# Patient Record
Sex: Female | Born: 1985 | Race: Black or African American | Hispanic: No | Marital: Single | Smoking: Current some day smoker
Health system: Southern US, Community
[De-identification: ages and names within clinical notes are randomized; demographics above are authoritative.]

## PROBLEM LIST (undated history)

## (undated) DIAGNOSIS — Z98891 History of uterine scar from previous surgery: Secondary | ICD-10-CM

## (undated) DIAGNOSIS — O99345 Other mental disorders complicating the puerperium: Secondary | ICD-10-CM

## (undated) DIAGNOSIS — F53 Postpartum depression: Secondary | ICD-10-CM

## (undated) DIAGNOSIS — IMO0002 Reserved for concepts with insufficient information to code with codable children: Secondary | ICD-10-CM

## (undated) DIAGNOSIS — O039 Complete or unspecified spontaneous abortion without complication: Secondary | ICD-10-CM

## (undated) DIAGNOSIS — R87619 Unspecified abnormal cytological findings in specimens from cervix uteri: Secondary | ICD-10-CM

## (undated) DIAGNOSIS — Z8751 Personal history of pre-term labor: Secondary | ICD-10-CM

## (undated) DIAGNOSIS — Z3490 Encounter for supervision of normal pregnancy, unspecified, unspecified trimester: Secondary | ICD-10-CM

## (undated) DIAGNOSIS — O4690 Antepartum hemorrhage, unspecified, unspecified trimester: Secondary | ICD-10-CM

## (undated) HISTORY — DX: Other mental disorders complicating the puerperium: O99.345

## (undated) HISTORY — DX: Reserved for concepts with insufficient information to code with codable children: IMO0002

## (undated) HISTORY — DX: History of uterine scar from previous surgery: Z98.891

## (undated) HISTORY — DX: Antepartum hemorrhage, unspecified, unspecified trimester: O46.90

## (undated) HISTORY — DX: Unspecified abnormal cytological findings in specimens from cervix uteri: R87.619

## (undated) HISTORY — DX: Complete or unspecified spontaneous abortion without complication: O03.9

## (undated) HISTORY — DX: Personal history of pre-term labor: Z87.51

## (undated) HISTORY — PX: FINGER FRACTURE SURGERY: SHX638

## (undated) HISTORY — DX: Postpartum depression: F53.0

## (undated) HISTORY — DX: Encounter for supervision of normal pregnancy, unspecified, unspecified trimester: Z34.90

---
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
## 2006-03-02 ENCOUNTER — Emergency Department (HOSPITAL_COMMUNITY): Admission: EM | Admit: 2006-03-02 | Discharge: 2006-03-03 | Payer: Self-pay | Admitting: Emergency Medicine

## 2007-04-07 IMAGING — US US OB COMP LESS 14 WK
1 series · 14 of 28 positions shown · non-contrast
Comparison: none

HISTORY: Abdominal pain, suspected tubal pregnancy

[Series 1: unknown · 0.33mm/px · 14 of 75 slices shown]
[im 3/75]
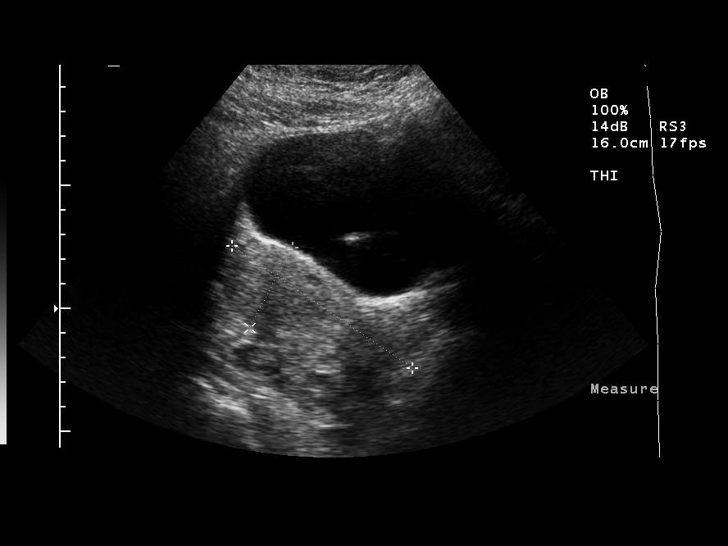
[im 9/75]
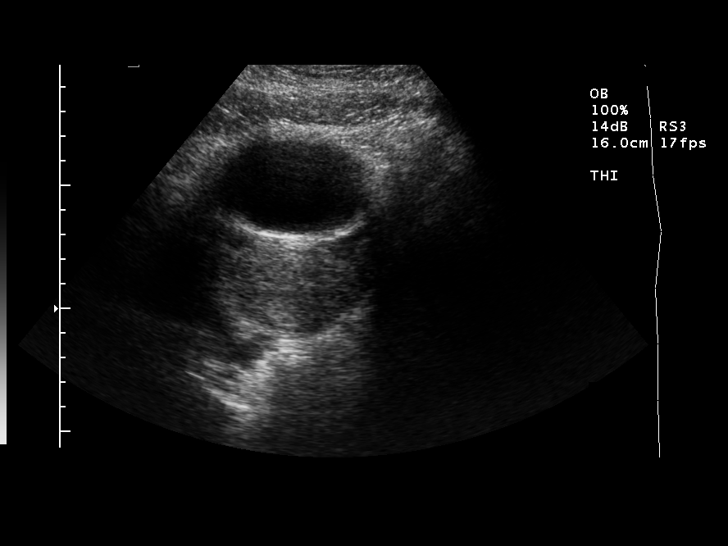
[im 14/75]
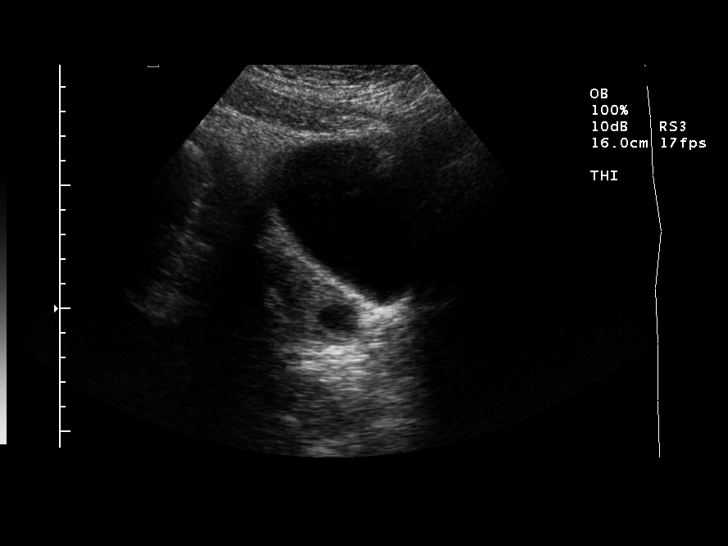
[im 20/75]
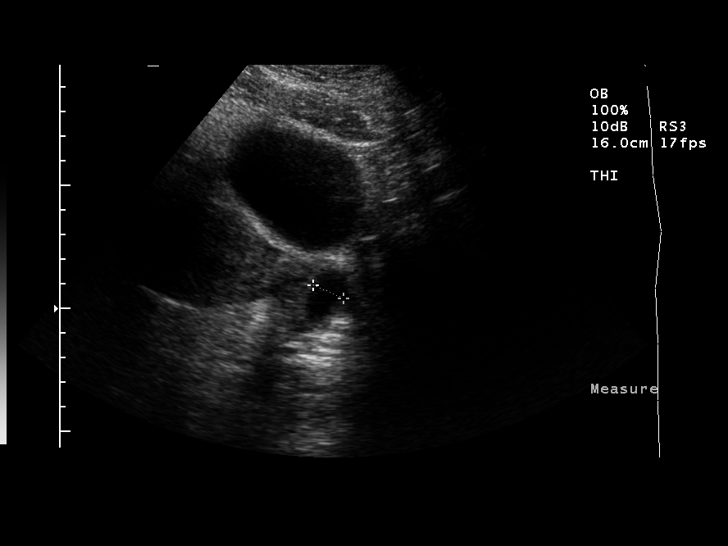
[im 25/75]
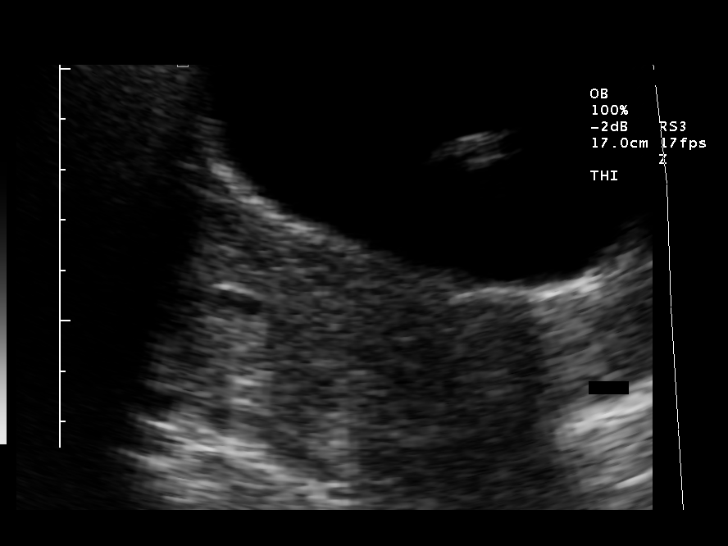
[im 31/75]
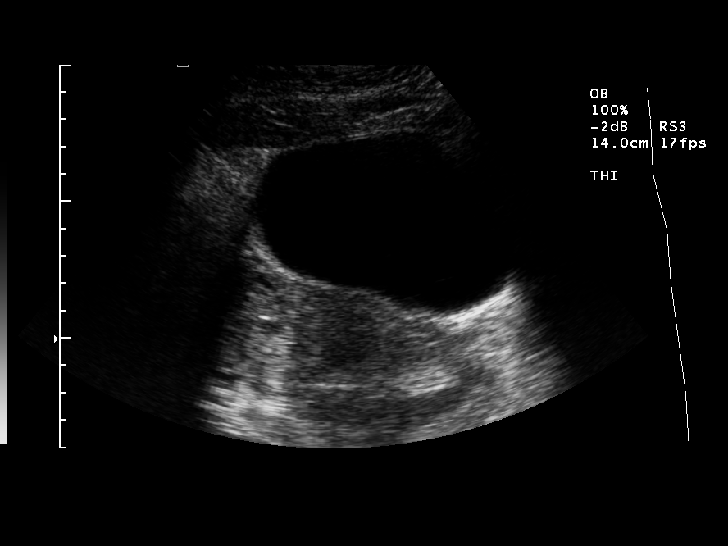
[im 36/75]
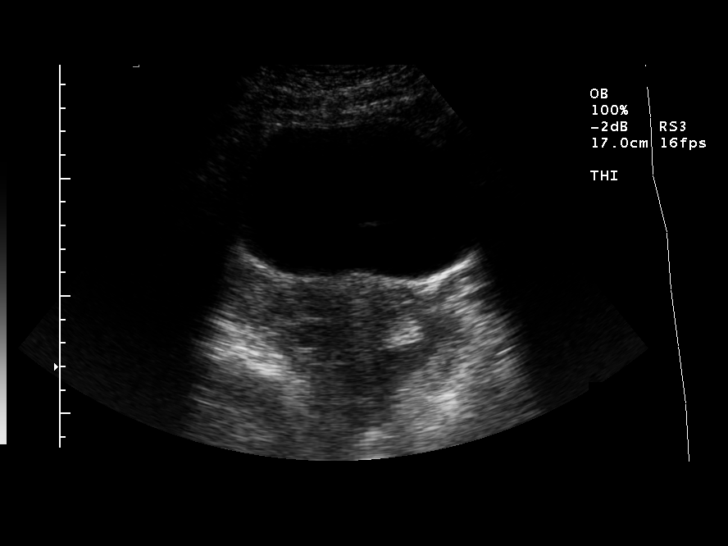
[im 42/75]
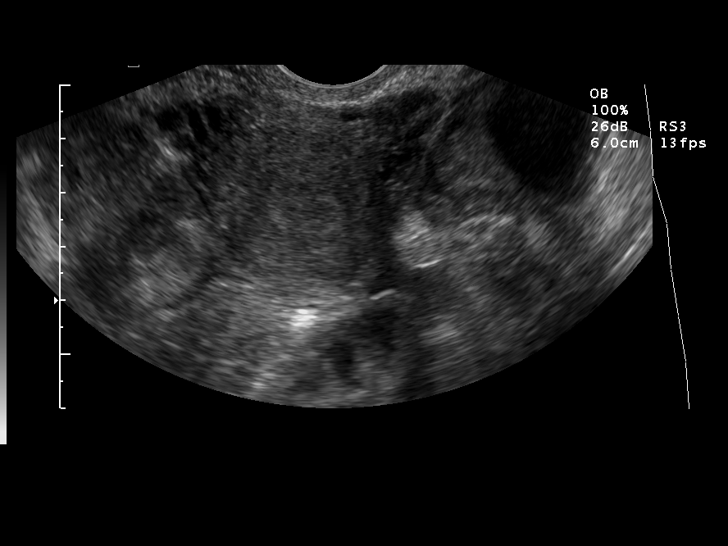
[im 47/75]
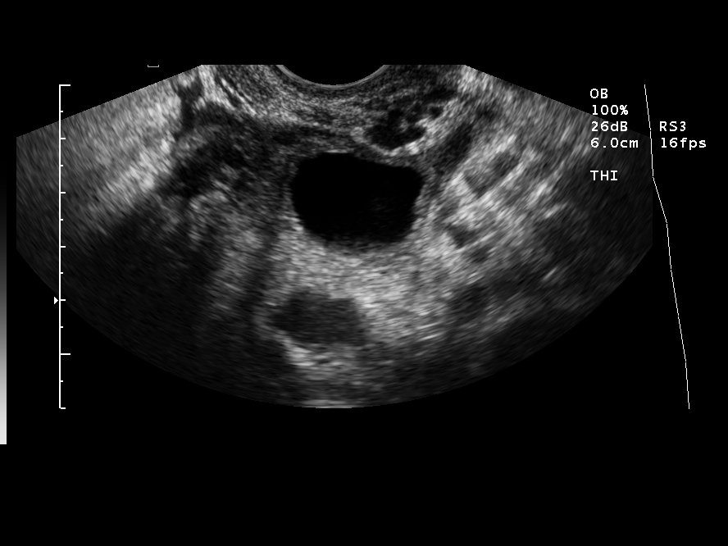
[im 53/75]
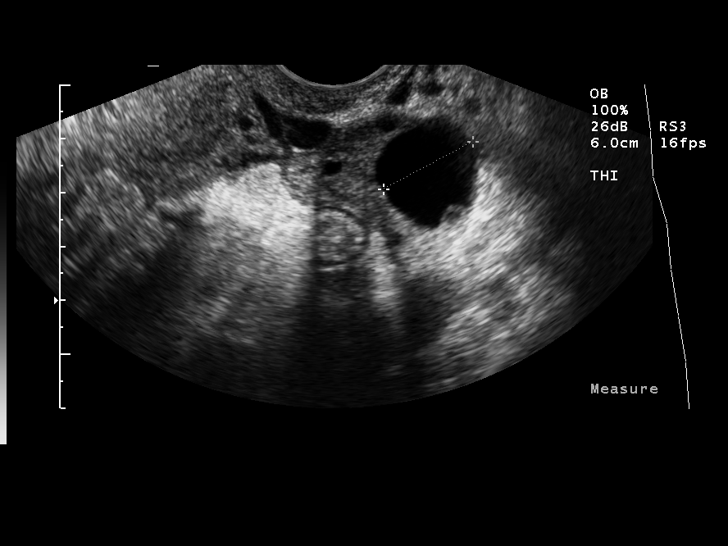
[im 58/75]
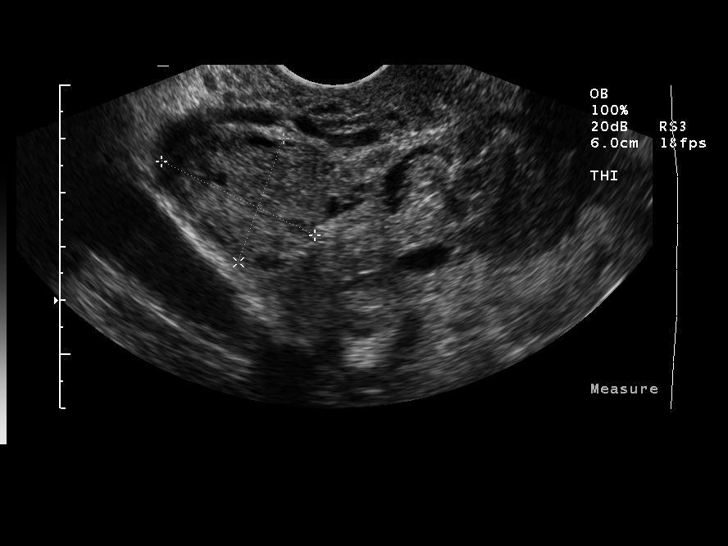
[im 64/75]
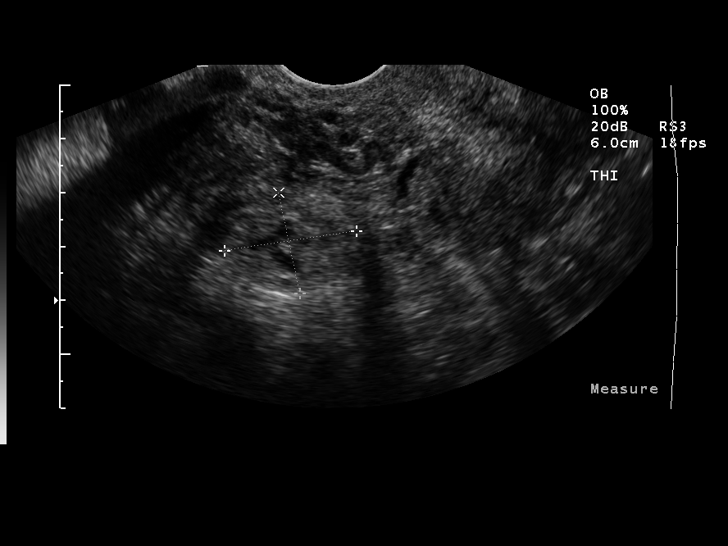
[im 69/75]
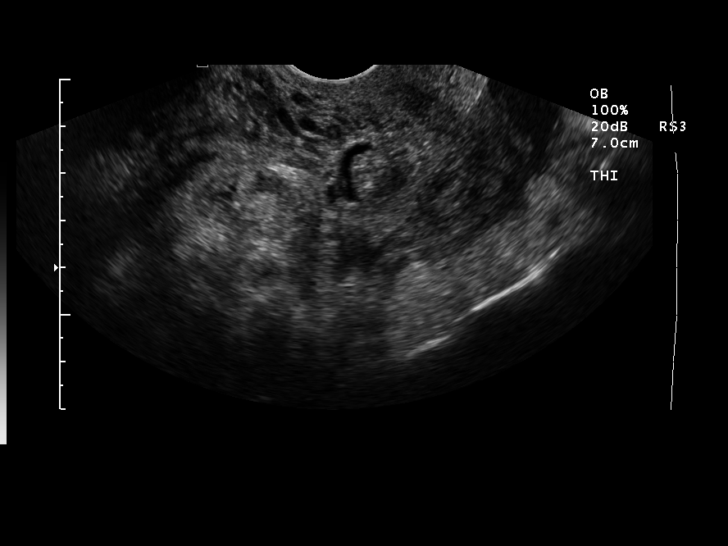
[im 75/75]
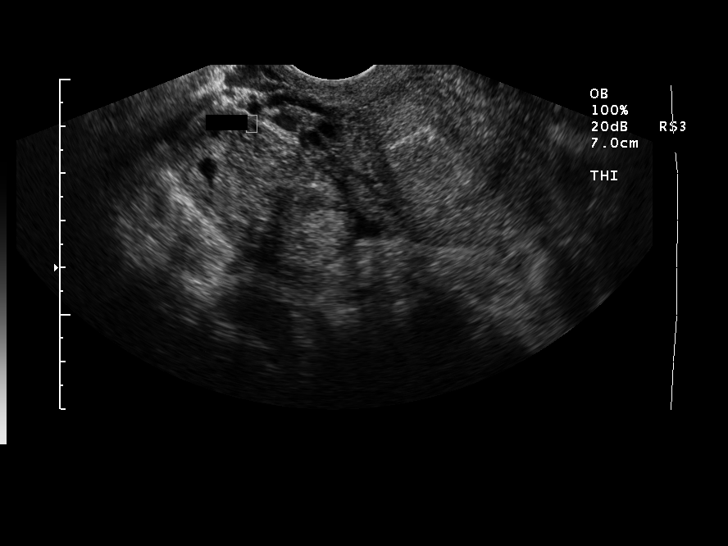

[14 of 28 positions shown; findings below may reference images not displayed]

ULTRASOUND OB COMPLETE LESS THAN 14 WEEKS:
ULTRASOUND OB TRANSVAGINAL MODIFY:

Transabdominal and endovaginal sonography of the gravid pelvis performed.

Uterus measures 8.8 cm length x 3.7 cm AP x 5.8 cm transverse.
Endometrial stripe 7 mm thick.
No intrauterine pregnancy.
Moderate complex free pelvic fluid

Right ovary normal size and morphology, 3.2 x 2.4 x 2.5 cm.
Left ovary normal size 3.4 x 2.6 x 2.7 cm.
Small cyst left ovary, 2.4 x 2.0 x 1.9 cm, question corpus luteal cyst.
Right adnexal mass, adjacent to right ovary.
Mass has ringlike configuration and measures 2.7 x 2.6 x 1.8 cm.
No significant regional hyperemia on color Doppler imaging.
IMPRESSION: No evidence of intrauterine gestation.
Probable ectopic pregnancy of the right adnexa, 2.7 cm in size.
No internal blood flow is seen within this right adnexal mass on color Doppler
imaging, although patient has reportedly received methotrexate 3 days prior.
Moderate complex free pelvic fluid, probably blood.

## 2009-10-06 HISTORY — PX: DILATION AND CURETTAGE OF UTERUS: SHX78

## 2010-09-04 ENCOUNTER — Emergency Department (HOSPITAL_COMMUNITY)
Admission: EM | Admit: 2010-09-04 | Discharge: 2010-09-04 | Payer: Self-pay | Source: Home / Self Care | Admitting: Emergency Medicine

## 2010-11-28 ENCOUNTER — Inpatient Hospital Stay (HOSPITAL_COMMUNITY)
Admission: AD | Admit: 2010-11-28 | Discharge: 2010-11-30 | DRG: 782 | Disposition: A | Discharge status: Home / Self Care | Payer: Medicaid Other | Source: Ambulatory Visit | Attending: Obstetrics & Gynecology | Admitting: Obstetrics & Gynecology

## 2010-11-28 DIAGNOSIS — O4290 Premature rupture of membranes, unspecified as to length of time between rupture and onset of labor, unspecified weeks of gestation: Principal | ICD-10-CM | POA: Diagnosis present

## 2010-11-28 LAB — RPR: RPR Ser Ql: NONREACTIVE

## 2010-11-28 LAB — RAPID URINE DRUG SCREEN, HOSP PERFORMED: Amphetamines: NOT DETECTED

## 2010-11-28 LAB — CBC
HCT: 34.3 % — ABNORMAL LOW (ref 36.0–46.0)
Hemoglobin: 11.6 g/dL — ABNORMAL LOW (ref 12.0–15.0)
MCH: 27.2 pg (ref 26.0–34.0)
MCHC: 33.8 g/dL (ref 30.0–36.0)
Platelets: 264 10*3/uL (ref 150–400)
RBC: 4.27 MIL/uL (ref 3.87–5.11)
RDW: 13.1 % (ref 11.5–15.5)

## 2010-11-29 LAB — CBC
Hemoglobin: 11.1 g/dL — ABNORMAL LOW (ref 12.0–15.0)
MCHC: 32.7 g/dL (ref 30.0–36.0)
MCV: 80.9 fL (ref 78.0–100.0)
RDW: 13.1 % (ref 11.5–15.5)

## 2010-11-30 LAB — CBC
HCT: 32.1 % — ABNORMAL LOW (ref 36.0–46.0)
Hemoglobin: 10.6 g/dL — ABNORMAL LOW (ref 12.0–15.0)
MCV: 80.7 fL (ref 78.0–100.0)
Platelets: 238 10*3/uL (ref 150–400)

## 2010-11-30 LAB — STREP B DNA PROBE

## 2010-12-02 LAB — GC/CHLAMYDIA PROBE AMP, URINE: Chlamydia, Swab/Urine, PCR: POSITIVE — AB

## 2010-12-17 LAB — URINALYSIS, ROUTINE W REFLEX MICROSCOPIC
Bilirubin Urine: NEGATIVE
Glucose, UA: NEGATIVE mg/dL
Protein, ur: NEGATIVE mg/dL

## 2010-12-17 LAB — URINE MICROSCOPIC-ADD ON

## 2010-12-23 ENCOUNTER — Other Ambulatory Visit: Payer: Self-pay | Admitting: Obstetrics and Gynecology

## 2010-12-23 DIAGNOSIS — O4290 Premature rupture of membranes, unspecified as to length of time between rupture and onset of labor, unspecified weeks of gestation: Secondary | ICD-10-CM

## 2010-12-25 ENCOUNTER — Ambulatory Visit (HOSPITAL_COMMUNITY): DRG: 782 | Payer: Medicaid Other | Attending: Obstetrics and Gynecology

## 2010-12-25 ENCOUNTER — Inpatient Hospital Stay (HOSPITAL_COMMUNITY)
Admission: AD | Admit: 2010-12-25 | Discharge: 2010-12-30 | DRG: 765 | Disposition: A | Discharge status: Home / Self Care | Payer: Medicaid Other | Source: Ambulatory Visit | Attending: Obstetrics and Gynecology | Admitting: Obstetrics and Gynecology

## 2010-12-25 ENCOUNTER — Inpatient Hospital Stay (HOSPITAL_COMMUNITY): Payer: Medicaid Other

## 2010-12-25 ENCOUNTER — Inpatient Hospital Stay (HOSPITAL_COMMUNITY)
Admission: AD | Admit: 2010-12-25 | Payer: Medicaid Other | Source: Ambulatory Visit | Admitting: Obstetrics and Gynecology

## 2010-12-25 DIAGNOSIS — O4290 Premature rupture of membranes, unspecified as to length of time between rupture and onset of labor, unspecified weeks of gestation: Principal | ICD-10-CM | POA: Diagnosis present

## 2010-12-25 DIAGNOSIS — O4690 Antepartum hemorrhage, unspecified, unspecified trimester: Secondary | ICD-10-CM | POA: Diagnosis present

## 2010-12-25 DIAGNOSIS — O321XX0 Maternal care for breech presentation, not applicable or unspecified: Secondary | ICD-10-CM | POA: Diagnosis present

## 2010-12-25 DIAGNOSIS — O2620 Pregnancy care for patient with recurrent pregnancy loss, unspecified trimester: Secondary | ICD-10-CM | POA: Diagnosis present

## 2010-12-25 LAB — COMPREHENSIVE METABOLIC PANEL
Alkaline Phosphatase: 59 U/L (ref 39–117)
CO2: 20 mEq/L (ref 19–32)
Calcium: 8.3 mg/dL — ABNORMAL LOW (ref 8.4–10.5)
Glucose, Bld: 140 mg/dL — ABNORMAL HIGH (ref 70–99)
Total Bilirubin: 0.1 mg/dL — ABNORMAL LOW (ref 0.3–1.2)
Total Protein: 5.8 g/dL — ABNORMAL LOW (ref 6.0–8.3)

## 2010-12-25 LAB — CBC
MCHC: 32.9 g/dL (ref 30.0–36.0)
MCV: 81.6 fL (ref 78.0–100.0)
Platelets: 234 10*3/uL (ref 150–400)
RDW: 13.3 % (ref 11.5–15.5)
WBC: 14.5 10*3/uL — ABNORMAL HIGH (ref 4.0–10.5)

## 2010-12-25 LAB — RAPID URINE DRUG SCREEN, HOSP PERFORMED
Amphetamines: NOT DETECTED
Barbiturates: NOT DETECTED
Cocaine: NOT DETECTED

## 2010-12-27 ENCOUNTER — Other Ambulatory Visit: Payer: Self-pay | Admitting: Obstetrics and Gynecology

## 2010-12-27 ENCOUNTER — Inpatient Hospital Stay (HOSPITAL_COMMUNITY): Payer: Medicaid Other

## 2010-12-27 DIAGNOSIS — O321XX0 Maternal care for breech presentation, not applicable or unspecified: Secondary | ICD-10-CM

## 2010-12-27 DIAGNOSIS — O4290 Premature rupture of membranes, unspecified as to length of time between rupture and onset of labor, unspecified weeks of gestation: Secondary | ICD-10-CM

## 2010-12-27 DIAGNOSIS — O4690 Antepartum hemorrhage, unspecified, unspecified trimester: Secondary | ICD-10-CM

## 2010-12-27 LAB — URINE CULTURE
Colony Count: >=100000
Culture  Setup Time: 201203221601

## 2010-12-28 LAB — CBC
MCHC: 33.1 g/dL (ref 30.0–36.0)
RBC: 3.94 MIL/uL (ref 3.87–5.11)

## 2011-01-03 NOTE — Discharge Summary (Signed)
  NAME:  Megan Stevens, Megan Stevens              ACCOUNT NO.:  [id number]  MEDICAL RECORD NO.:  [id number]           PATIENT TYPE:  I  LOCATION:  9151                          FACILITY:  [facility]  PHYSICIAN:  Allie Bossier, MD        DATE OF BIRTH:  June 09, 1986  DATE OF ADMISSION:  11/28/2010 DATE OF DISCHARGE:  11/30/2010                              DISCHARGE SUMMARY   ADMISSION DIAGNOSIS:  Preterm premature rupture of membranes at 20 weeks and 5 days.  DISCHARGE DIAGNOSIS:  Preterm premature rupture of membranes at 20 weeks and 5 days.  CONDITION:  Stable.  Vitals routine.  DIET:  As tolerated.  ACTIVITY:  I have suggested modified bedrest to encourage reaccumulation of fluid, and I have strongly stressed pelvic rest.  MEDICATIONS:  Prenatal vitamins daily.  She is not to take Tylenol or ibuprofen or other temperature-lowering drugs.  BRIEF HISTORY OF PRESENT ILLNESS AND HOSPITAL COURSE:  Megan Stevens is a 25 year old single black G7 P 0-1-5-0 at 20 weeks and 5 days.  [facility]  [facility] with complaint of rupture.  Dr. Emelda Fear evaluated her and noted gross rupture.  Her cervix was noted to be closed on speculum exam.  She was transferred to [facility] and started on IV ampicillin and erythromycin.  NICU consult was obtained.  When Dr. Emelda Fear spoke with the patient, he thought that she was interested in evacuation of the pregnancy by induction of labor; however, when she arrived here, she was no longer desires of that treatment.  She preferred to do expected management.  She has been monitored here with daily white count that had fallen on a daily basis.  On the day of discharge, November 30, 2010, her white count is 9.9.  She had remained afebrile throughout, and her physical exam has been normal, specifically she has had no abdominal pain.  By the day of discharge, she denied any further leaking, she denies pain, vaginal  bleeding, or contractions.  She will follow up on December 06, 2010, at Dr. Rayna Sexton, and I have stressed that should she develop a temperature or abdominal pain then she should come back to [facility] as soon as possible.     Allie Bossier, MD     MCD/MEDQ  D:  11/30/2010  T:  11/30/2010  Job:  [id number]  Electronically Signed by Nicholaus Bloom MD on 01/03/2011 11:22:34 AM

## 2011-01-05 ENCOUNTER — Inpatient Hospital Stay (HOSPITAL_COMMUNITY)
Admission: AD | Admit: 2011-01-05 | Discharge: 2011-01-06 | Disposition: A | Discharge status: Home / Self Care | Payer: Medicaid Other | Source: Ambulatory Visit | Attending: Obstetrics & Gynecology | Admitting: Obstetrics & Gynecology

## 2011-01-05 DIAGNOSIS — O909 Complication of the puerperium, unspecified: Secondary | ICD-10-CM | POA: Insufficient documentation

## 2011-01-06 NOTE — Op Note (Signed)
NAME:  SHANEE, BATCH NO.:  [id number]  MEDICAL RECORD NO.:  [id number]           PATIENT TYPE:  I  LOCATION:  9305                          [facility]ILITY[facility]  PHYSICIAN:  Scheryl Darter, MD       DATE OF BIRTH:  27-May-1986  DATE OF PROCEDURE: DATE OF DISCHARGE:                              OPERATIVE REPORT   PROCEDURE:  Primary classical cesarean section.  PREOPERATIVE DIAGNOSES:  Intrauterine pregnancy 24 weeks 2 days gestation with prolonged premature rupture of membranes and preterm labor with breech presentation and vaginal bleeding.  POSTOPERATIVE DIAGNOSIS:  Intrauterine pregnancy delivered.  SURGEON:  Scheryl Darter, MD.  ANESTHESIA:  Spinal.  FINDINGS:  Live born female delivered at 11:58, 1 pound 7 ounces with Apgar's 2 and 6.  SPECIMENS:  Placenta.  ESTIMATED BLOOD LOSS:  500 mL.  COMPLICATION:  Laceration of baby's right chest.  DRAIN:  Foley catheter.  COUNTS:  Correct.  OPERATIVE COURSE:  The patient gave written consent for cesarean section at 24 weeks 2 days gestation with over 4 weeks of rupture of membranes and vaginal bleeding starting today with evidence of preterm labor, breech presentation.  The patient was counseled as to the risks of surgery and premature delivery.  A NICU consultation has been obtained by Dr. Joana Reamer on March 22.  The patient identification was confirmed. She was brought to the operating room and spinal anesthesia was induced. She was placed in dorsal supine position, left lateral tilt.  Abdomen was sterilely prepped and draped and Foley catheter was placed.  The patient received IV Kefzol 1 gram.  After Foley draping, a #10 blade was used to make a Pfannenstiel incision down to the fascia.  The fascia was incised and the incision was extended transversely.  The fascia was incised transversely.  The anterior peritoneum was exposed and entered at midline and incision was extended vertically with  Metzenbaum scissors.  Bladder blade was inserted.  A bladder flap was created by incising with Metzenbaum scissors at the vesicouterine fold transversely.  Bladder blade was reinserted.  There was a poor development of lower uterine segment and severe prematurity, I proceeded with a vertical uterine incision at the midline.  A #10 blade was used to incise the uterus.  There was very little setting of the lower uterine segment.  The incision was extended vertically with bandage scissors.  During the entry into the uterus, no membranes were encountered and no fluid was expressed.  When the fetus was elevated from the pelvis, it was noted that there was about a 3 cm laceration on the chest that occurred during entry into the uterus.  Infant was delivered atraumatically.  Cord was clamped and cut and infant was handed to nursery personnel including Dr. Eilleen Kempf in attendance.  Infant was female delivered at 11:58.  Weight was 1 pound 7 ounces.  Apgar's were 2 and 6 grams.  The placenta was delivered and uterine cavity was explored.  The patient received IV Pitocin.  The bladder blade was reinserted.  A 0 Vicryl was used with running locking suture to close the first layer of the  uterus.  Second layer followed with a running suture of 0 Vicryl.  The uterine serosa was closed with a baseball type stitch with 0 Vicryl.  Hemostasis was obtained with interrupted figure- of-eight sutures of 0 Vicryl.  Pelvis was irrigated.  Anterior peritoneum was closed with 2-0 Vicryl.  The fascia was closed with a running suture of 0 Vicryl.  Hemostasis was assured in the incision. Interrupted sutures of a 2-0 plain gut replaced in the subcutaneous layer due to the patient's obesity.  The skin was closed with a running subcuticular suture of 4-0 Vicryl.  Sterile dressing was applied.  The patient tolerated the procedure well.  She was brought in stable condition to recovery room.     Scheryl Darter,  MD     JA/MEDQ  D:  12/27/2010  T:  12/28/2010  Job:  [id number]  Electronically Signed by Scheryl Darter MD on 01/06/2011 11:36:16 AM

## 2011-01-13 NOTE — Discharge Summary (Signed)
  [name], Megan Stevens              ACCOUNT NO.:  [id number]  MEDICAL RECORD NO.:  [id number]           PATIENT TYPE:  I  LOCATION:  9305                          [facility]ACILITY[facility]  PHYSICIAN:  Horton Chin, MD DATE OF BIRTH:  07/31/1986  DATE OF ADMISSION:  12/25/2010 DATE OF DISCHARGE:  12/30/2010                              DISCHARGE SUMMARY   DISCHARGE DIAGNOSES: 1. Preterm premature rupture of membranes. 2. Preterm labor. 3. Breech presentation. 4. Status post primary classical cesarean section.  DISCHARGE MEDICATIONS: 1. Prenatal vitamin 1 tablet p.o. daily. 2. Percocet 5/325 p.o. q.6 h. p.r.n. pain. 3. Docusate sodium 100 mg p.o. b.i.d. p.r.n. constipation. 4. Iron sulfate 325 mg p.o. b.i.d. 5. Depo-Provera.  BRIEF HOSPITAL COURSE:  Megan Stevens is a 25 year old G7, now P0-2-1- 5, who has had PPROM on November 28, 2010 and she was admitted once she was had a viable pregnancy at 24 weeks.  On December 25, 2010,  she  spontaneously went into labor and was delivered by cesarean section due to breech presentation.  There are no complications from surgery. EBL 500 mL.  On the date of delivery the patient was 24 weeks and 2 days' gestation.  She had a viable female who was transferred to the NICU.  The patient had a routine postpartum course and was discharged on postop day #3.  Pain was well controlled, who is afebrile with vaginal bleeding decreasing and no other complaints.  FOLLOWUP ISSUES AND RECOMMENDATIONS:  The patient is to follow up at [facility] in 6 weeks for her postpartum visit.  DISCHARGE CONDITION:  The patient was discharged to home in stable medical condition.    ______________________________ Ardyth Gal, MD   ______________________________ Horton Chin, MD   CR/MEDQ  D:  12/30/2010  T:  12/31/2010  Job:  [id number]  Electronically Signed by Ardyth Gal MD on 01/01/2011 04:33:09 PM Electronically Signed by Jaynie Collins MD on 01/13/2011 11:53:44 AM

## 2011-02-21 NOTE — Consult Note (Signed)
NAME:  Megan Stevens, Megan Stevens NO.:  [id number]   MEDICAL RECORD NO.:  [id number]          PATIENT TYPE:  EMS   LOCATION:  ED                            FACILITY:  [facility]   PHYSICIAN:  Tilda Burrow, M.D. DATE OF BIRTH:  04/24/1986   DATE OF CONSULTATION:  DATE OF DISCHARGE:  04/14/2005                                   CONSULTATION   CHIEF COMPLAINT:  Lower abdominal pain since 5 a.m.   This 25 year old female who was being treated for presumed right ectopic  pregnancy was seen in the emergency room, presenting at approximately 5:40  a.m. on April 14, 2005.  She was evaluated by Dr. Rhae Lerner. Margretta Ditty, who  identified that she still has a quantitative HCG of 500, declining, a normal  white count and is complaining of diffuse lower abdominal pain and unwilling  to allow pelvic exam.  She is a limited historian.  She is accompanied by  her mother and significant other.   Labs here include a quantitative HCG which has dropped to approximately 500.  The patient had a pelvic ultrasound performed which showed a 2.7-cm cyst in  the right adnexa cystic complex, felt to represent a right ectopic.  The  patient was given extensive discussion of the risks and benefits of surgical  versus medical therapy.  The patient wishes to avoid surgical procedure and  understands the intrinsic risk of rebleeding or increasing intermittent  episodes of increasing pain associated with continued observation.  I have  painted a picture that it will take 4-6 weeks for medical management to  work.  The patient understands this and acknowledges having understood the  statements and desires to proceed with increased analgesics at home and  continued observation.  The patient is therefore placed on Percocet 7.5/500,  2 tablets every 6 hours p.r.n. pain, to follow up in 1 week in our office.       JVF/MEDQ  D:  04/14/2005  T:  04/14/2005  Job:  [id number]   cc:   Tilda Burrow, M.D.  Fax:  [phone]
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
## 2011-02-21 NOTE — H&P (Signed)
NAME:  Megan Stevens, Megan Stevens              ACCOUNT NO.:  [id number]   MEDICAL RECORD NO.:  [id number]          PATIENT TYPE:  OIB   LOCATION:  A428                          FACILITY:  [facility]   PHYSICIAN:  Tilda Burrow, M.D. DATE OF BIRTH:  [birth year]   DATE OF ADMISSION:  07/27/2005  DATE OF DISCHARGE:  LH                                HISTORY & PHYSICAL   ADMISSION DIAGNOSIS:  Miscarriage.   HISTORY OF PRESENT ILLNESS:  This 25 year old female with known pregnancy,  blood type B positive, was seen Wednesday of last week at [facility]  for initial prenatal care evaluation which showed a small gestational sac  according to the patient history (the patient as historian).  She was placed  on Progesterone suppositories with a gestational sac seen.  She describes  that there was a small fetal pole inside the sac.  Intrauterine pregnancy  was therefore confirmed.  She presents with passage of some tissue and is  observed overnight to see if the tissue has been completely expelled.   LABORATORY DATA:  Hemoglobin 13.3, hematocrit 39.5, blood type B positive.  Quantitative HCG 1369.   PHYSICAL EXAMINATION:  Examination by Nicoletta Dress. Colon Branch, M.D., ER physician,  shows that she has expelled a small amount of what appears to be tissue  which is sent for histology.   HOSPITAL COURSE:  Observation overnight shows resolution of bleeding with  minimal spotting since arrival.  Transvaginal ultrasound was performed by me  showing a small anteflexed uterus, nontender to uterine contact with vaginal  probe.  The uterine fundus is now empty.  There is a small amount of tissue,  less than 6 mm in transverse diameter located in the lower uterine segment  representing residual _decidua__that is likely being expelled.   IMPRESSION:  She is having a spontaneous abortion and that her body is  handling it nicely.   PLAN:  The patient is given routine miscarriage instructions, told to expect  passage of  some small residual tissue fragments.  Followup is advised in  eight days in our office for speculum examination and urine pregnancy test.   Contraception is discussed at length.  The patient has had birth control  pills, but is not currently taking them.  She and her partner have not been  discussing contraception.  We had a lengthy discussion about these issues  this a.m.  Plans are for her to begin Ortho-Evra at her next visit.  Prescription given at this time for Ortho-Evra x1 year.      Tilda Burrow, M.D.  Electronically Signed     JVF/MEDQ  D:  07/27/2005  T:  07/27/2005  Job:  [id number]

## 2011-02-21 NOTE — H&P (Signed)
NAME:  Megan Stevens, Megan Stevens                        ACCOUNT NO.:  [id number]   MEDICAL RECORD NO.:  [id number]                   PATIENT TYPE:  OIB   LOCATION:  A415                                 FACILITY:  [facility]   PHYSICIAN:  Tilda Burrow, M.D.              DATE OF BIRTH:  1986/07/13   DATE OF ADMISSION:  04/15/2004  DATE OF DISCHARGE:                                HISTORY & PHYSICAL   INDICATION:  Spontaneous abortion, 19-weeks size.   HISTORY OF PRESENT ILLNESS:  This 25 year old gravida 2, para 0, admitted  approximately 1 a.m. with bright red bleeding and evaluated including an  ultrasound which showed absence of amniotic fluid and fetal malpresentation  with a chest presentation with fetal body acutely flexed with heart rate  varying from 90 to 140, with contractions which were palpable.  Posterior  fundal placenta is noted.  She had began bleeding approximately one hour  before my evaluation and presently promptly to labor and delivery with  soaked clothing in a 10 to 15 inch spot.  The patient has poor tolerance of  exam.  Since speculum exam is impossible and digital exam barely tolerated,  inadequate to fully assess.  An ultrasound showed the suspicion of fetal  compromise.  At this early previable gestation, no interventions are  indicated or possible.  She is observed through the night, and progressed to  spontaneous expulsion at approximately 6:40 a.m. of a ecchymotic stillborn  female fetus, weight __________.  Apgars 0/0.  The placenta is not yet  expelled at the time of this dictation.  The patient can barely tolerate  digital exam again due to anxiety component issues.  The cervix does have  some tissue fragments in the os.  We will continue IV oxytocin and await  spontaneous expulsion of placenta.  Bleeding is not heavy at this time.   LABORATORY DATA:  Admission labs are pending and not part of the chart at  this time other than hemoglobin of 10.4, hematocrit of  31.4 as compared to  her initial intake, hemoglobin 12.6, hematocrit 38.8.     ___________________________________________                                         Tilda Burrow, M.D.   JVF/MEDQ  D:  04/16/2004  T:  04/16/2004  Job:  [id number]

## 2011-02-21 NOTE — Op Note (Signed)
   NAME:  Megan Stevens, Megan Stevens                        ACCOUNT NO.:  [id number]   MEDICAL RECORD NO.:  [id number]                   PATIENT TYPE:  AMB   LOCATION:  DAY                                  FACILITY:  [facility]   PHYSICIAN:  Lazaro Arms, M.D.                DATE OF BIRTH:  [birth year]   DATE OF PROCEDURE:  07/04/2003  DATE OF DISCHARGE:                                 OPERATIVE REPORT   PREOPERATIVE DIAGNOSIS:  Missed abortion in the first trimester.   POSTOPERATIVE DIAGNOSIS:  Missed abortion in the first trimester.   PROCEDURE:  Cervical dilatation with suction curettage.   SURGEON:  Lazaro Arms, M.D.   ANESTHESIA:  MAC with paracervical block.   DESCRIPTION OF PROCEDURE:  The patient was known to have a nonviable  pregnancy since May 26, 2003.  The patient had a great deal of difficulty  coming to grips with that reality.  We did her sonogram at the hospital on  May 29, 2003, which confirmed it.  She went to the emergency room on  June 16, 2003, stating that the baby had not moved when it had been  nonviable at eight weeks for 2-1/2 weeks by that point.  She came into the  office again on September 21, willing to have another ultrasound just to  make sure.  I sent her to the hospital.  Once again they confirmed debris in  the uterus, not even a sac seen at that point.  The patient denied any  bleeding.  As a result, she came into the office yesterday wanting to have a  D&C.   The patient was taken to the operating room and placed in the supine  position, where she underwent IV sedation.  She was then placed in the  dorsal lithotomy position and prepped and draped in the usual sterile  fashion.  The cervix was grasped, a paracervical block was placed, 10 mL of  0.5% Marcaine with 1:200,000 epinephrine bilaterally, 20 mL total.  The  cervix was dilated serially to allow passage of an 8 curved suction curette.  Three passes were made, all tissue was removed.  A  gentle sharp curettage  was performed and the uterus had good cry in all areas.  The patient  tolerated the procedure well.  She was taken to the recovery room in good  and stable condition.  All counts were correct.      ___________________________________________                                            Lazaro Arms, M.D.   LHE/MEDQ  D:  07/04/2003  T:  07/04/2003  Job:  [id number]
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
## 2011-10-26 ENCOUNTER — Emergency Department (HOSPITAL_COMMUNITY)
Admission: EM | Admit: 2011-10-26 | Discharge: 2011-10-26 | Disposition: A | Discharge status: Home / Self Care | Payer: Self-pay | Attending: Emergency Medicine | Admitting: Emergency Medicine

## 2011-10-26 ENCOUNTER — Encounter (HOSPITAL_COMMUNITY): Payer: Self-pay | Admitting: *Deleted

## 2011-10-26 DIAGNOSIS — Y92009 Unspecified place in unspecified non-institutional (private) residence as the place of occurrence of the external cause: Secondary | ICD-10-CM | POA: Insufficient documentation

## 2011-10-26 DIAGNOSIS — W503XXA Accidental bite by another person, initial encounter: Secondary | ICD-10-CM | POA: Insufficient documentation

## 2011-10-26 DIAGNOSIS — S51809A Unspecified open wound of unspecified forearm, initial encounter: Secondary | ICD-10-CM | POA: Insufficient documentation

## 2011-10-26 MED ORDER — TETANUS-DIPHTH-ACELL PERTUSSIS 5-2.5-18.5 LF-MCG/0.5 IM SUSP
0.5000 mL | Freq: Once | INTRAMUSCULAR | Status: AC
  Administered 2011-10-26: 0.5 mL via INTRAMUSCULAR
  Filled 2011-10-26: qty 0.5

## 2011-10-26 MED ORDER — AMOXICILLIN-POT CLAVULANATE 875-125 MG PO TABS
1.0000 | ORAL_TABLET | Freq: Once | ORAL | Status: AC
  Administered 2011-10-26: 1 via ORAL
  Filled 2011-10-26: qty 1

## 2011-10-26 MED ORDER — IBUPROFEN 800 MG PO TABS
800.0000 mg | ORAL_TABLET | Freq: Once | ORAL | Status: AC
  Administered 2011-10-26: 800 mg via ORAL
  Filled 2011-10-26: qty 1

## 2011-10-26 MED ORDER — AMOXICILLIN-POT CLAVULANATE 875-125 MG PO TABS: 1.0000 | ORAL_TABLET | Freq: Two times a day (BID) | ORAL | Status: AC
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
## 2011-10-26 NOTE — ED Provider Notes (Signed)
Medical screening examination/treatment/procedure(s) were performed by non-physician practitioner and as supervising physician I was immediately available for consultation/collaboration.   Hurman Horn, MD 10/26/11 [phone]

## 2011-10-26 NOTE — ED Notes (Signed)
Pt a/ox4. Resp even and unlabored. NAD at this time. D/C instruction reviewed with pt. Pt verbalized understanding. Pt ambulated to lobby with steady gate. 

## 2012-09-22 ENCOUNTER — Emergency Department (HOSPITAL_COMMUNITY)
Admission: EM | Admit: 2012-09-22 | Discharge: 2012-09-22 | Disposition: A | Discharge status: Home / Self Care | Payer: Medicaid Other | Attending: Emergency Medicine | Admitting: Emergency Medicine

## 2012-09-22 ENCOUNTER — Encounter (HOSPITAL_COMMUNITY): Payer: Self-pay | Admitting: Emergency Medicine

## 2012-09-22 DIAGNOSIS — A599 Trichomoniasis, unspecified: Secondary | ICD-10-CM | POA: Insufficient documentation

## 2012-09-22 DIAGNOSIS — F17200 Nicotine dependence, unspecified, uncomplicated: Secondary | ICD-10-CM | POA: Insufficient documentation

## 2012-09-22 DIAGNOSIS — R51 Headache: Secondary | ICD-10-CM | POA: Insufficient documentation

## 2012-09-22 DIAGNOSIS — K529 Noninfective gastroenteritis and colitis, unspecified: Secondary | ICD-10-CM

## 2012-09-22 DIAGNOSIS — Z3490 Encounter for supervision of normal pregnancy, unspecified, unspecified trimester: Secondary | ICD-10-CM

## 2012-09-22 DIAGNOSIS — Z3201 Encounter for pregnancy test, result positive: Secondary | ICD-10-CM | POA: Insufficient documentation

## 2012-09-22 DIAGNOSIS — K5289 Other specified noninfective gastroenteritis and colitis: Secondary | ICD-10-CM | POA: Insufficient documentation

## 2012-09-22 DIAGNOSIS — Z79899 Other long term (current) drug therapy: Secondary | ICD-10-CM | POA: Insufficient documentation

## 2012-09-22 LAB — URINALYSIS, ROUTINE W REFLEX MICROSCOPIC
Bilirubin Urine: NEGATIVE
Glucose, UA: NEGATIVE mg/dL
Hgb urine dipstick: NEGATIVE
Ketones, ur: 15 mg/dL — AB
Protein, ur: NEGATIVE mg/dL
pH: 6 (ref 5.0–8.0)

## 2012-09-22 LAB — URINE MICROSCOPIC-ADD ON

## 2012-09-22 MED ORDER — PRENATAL VITAMINS 28-0.8 MG PO TABS: 1.0000 | ORAL_TABLET | Freq: Every day | ORAL | Status: DC

## 2012-09-22 MED ORDER — ONDANSETRON 4 MG PO TBDP
4.0000 mg | ORAL_TABLET | Freq: Once | ORAL | Status: AC
  Administered 2012-09-22: 4 mg via ORAL
  Filled 2012-09-22: qty 1

## 2012-09-22 MED ORDER — PROMETHAZINE HCL 25 MG PO TABS: 25.0000 mg | ORAL_TABLET | Freq: Four times a day (QID) | ORAL | Status: DC | PRN

## 2012-09-22 NOTE — ED Notes (Signed)
Pt c/o nausea x one week with vomiting starting yesterday.
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
## 2012-09-24 LAB — URINE CULTURE: Colony Count: 10000

## 2012-10-05 ENCOUNTER — Other Ambulatory Visit: Payer: Self-pay | Admitting: Adult Health

## 2012-10-05 ENCOUNTER — Other Ambulatory Visit (HOSPITAL_COMMUNITY)
Admission: RE | Admit: 2012-10-05 | Discharge: 2012-10-05 | Disposition: A | Discharge status: Home / Self Care | Payer: Medicaid Other | Source: Ambulatory Visit | Attending: Obstetrics and Gynecology | Admitting: Obstetrics and Gynecology

## 2012-10-05 DIAGNOSIS — Z01419 Encounter for gynecological examination (general) (routine) without abnormal findings: Secondary | ICD-10-CM | POA: Insufficient documentation

## 2012-10-05 DIAGNOSIS — Z113 Encounter for screening for infections with a predominantly sexual mode of transmission: Secondary | ICD-10-CM | POA: Insufficient documentation

## 2012-10-05 DIAGNOSIS — R87810 Cervical high risk human papillomavirus (HPV) DNA test positive: Secondary | ICD-10-CM | POA: Insufficient documentation

## 2012-10-05 DIAGNOSIS — Z1151 Encounter for screening for human papillomavirus (HPV): Secondary | ICD-10-CM | POA: Insufficient documentation

## 2012-10-05 LAB — OB RESULTS CONSOLE RUBELLA ANTIBODY, IGM: Rubella: IMMUNE

## 2012-10-05 LAB — OB RESULTS CONSOLE ABO/RH

## 2012-10-05 LAB — OB RESULTS CONSOLE ANTIBODY SCREEN: Antibody Screen: NEGATIVE

## 2012-10-19 ENCOUNTER — Encounter (HOSPITAL_COMMUNITY): Payer: Self-pay | Admitting: *Deleted

## 2012-10-19 ENCOUNTER — Emergency Department (HOSPITAL_COMMUNITY)
Admission: EM | Admit: 2012-10-19 | Discharge: 2012-10-19 | Disposition: A | Discharge status: Home / Self Care | Payer: Medicaid Other | Attending: Emergency Medicine | Admitting: Emergency Medicine

## 2012-10-19 DIAGNOSIS — N949 Unspecified condition associated with female genital organs and menstrual cycle: Secondary | ICD-10-CM | POA: Insufficient documentation

## 2012-10-19 DIAGNOSIS — N898 Other specified noninflammatory disorders of vagina: Secondary | ICD-10-CM | POA: Insufficient documentation

## 2012-10-19 DIAGNOSIS — F17200 Nicotine dependence, unspecified, uncomplicated: Secondary | ICD-10-CM | POA: Insufficient documentation

## 2012-10-19 DIAGNOSIS — N939 Abnormal uterine and vaginal bleeding, unspecified: Secondary | ICD-10-CM

## 2012-10-19 LAB — URINALYSIS, ROUTINE W REFLEX MICROSCOPIC
Glucose, UA: NEGATIVE mg/dL
Protein, ur: NEGATIVE mg/dL
Urobilinogen, UA: 0.2 mg/dL (ref 0.0–1.0)

## 2012-10-19 LAB — HCG, QUANTITATIVE, PREGNANCY: hCG, Beta Chain, Quant, S: 72184 m[IU]/mL — ABNORMAL HIGH (ref <5)

## 2012-10-19 LAB — URINE MICROSCOPIC-ADD ON

## 2012-10-19 NOTE — ED Notes (Addendum)
Went to room and patient not in room. Hospital gown laying on bed and patient clothes not in room. Called registration and they stated patient left hospital. MD notified.

## 2012-10-19 NOTE — ED Notes (Signed)
Pt is [redacted] weeks pregnant, started bleeding 3o min pta.  No cramping.

## 2012-10-19 NOTE — ED Provider Notes (Signed)
History  This chart was scribed for Benny Lennert, MD by Ardeen Jourdain, ED Scribe. This patient was seen in room APA06/APA06 and the patient's care was started at 2116.  CSN: [id number]  Arrival date & time 10/19/12  6578   First MD Initiated Contact with Patient 10/19/12 2116      Chief Complaint  Patient presents with  . Vaginal Bleeding     Patient is a 27 y.o. female presenting with vaginal bleeding. The history is provided by the patient. No language interpreter was used.  Vaginal Bleeding This is a new problem. The current episode started 1 to 2 hours ago. The problem occurs constantly. The problem has been gradually improving. Pertinent negatives include no chest pain, no abdominal pain, no headaches and no shortness of breath. Nothing aggravates the symptoms. Nothing relieves the symptoms. She has tried nothing for the symptoms.    DIANNIA HOGENSON is a 27 y.o. female who presents to the Emergency Department complaining of vaginal bleeding with associated intermittent cramping. She states the bleeding was as heavy as a period at first but has become lighter. 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   G: 8 A: 6 P: 1  History reviewed. No pertinent past medical history.  Past Surgical History  Procedure Date  . Cesarean section     History reviewed. No pertinent family history.  History  Substance Use Topics  . Smoking status: Current Every Day Smoker -- 1.0 packs/day    Types: Cigarettes  . Smokeless tobacco: Not on file  . Alcohol Use: No    OB History    Grav Para Term Preterm Abortions TAB SAB Ect Mult Living   1               Review of Systems  Constitutional: Negative for fatigue.  HENT: Negative for congestion, sinus pressure and ear discharge.   Eyes: Negative for discharge.  Respiratory: Negative for cough and shortness of breath.   Cardiovascular: Negative for chest pain.  Gastrointestinal: Negative for nausea, vomiting, abdominal  pain and diarrhea.  Genitourinary: Positive for vaginal bleeding and vaginal pain. Negative for frequency, hematuria and vaginal discharge.  Musculoskeletal: Negative for back pain.  Skin: Negative for rash.  Neurological: Negative for seizures and headaches.  Hematological: Negative.   Psychiatric/Behavioral: Negative for hallucinations.  All other systems reviewed and are negative.    Allergies  Review of patient's allergies indicates no known allergies.  Home Medications   Current Outpatient Rx  Name  Route  Sig  Dispense  Refill  . IBUPROFEN 200 MG PO TABS   Oral   Take 400 mg by mouth daily as needed. For occasional pain/headache pain         . PRENATAL VITAMINS 28-0.8 MG PO TABS   Oral   Take 1 tablet by mouth daily.   30 tablet   3   . PROMETHAZINE HCL 25 MG PO TABS   Oral   Take 1 tablet (25 mg total) by mouth every 6 (six) hours as needed for nausea.   20 tablet   0     Triage Vitals: BP 127/78  Pulse 90  Temp 97.9 F (36.6 C) (Oral)  Resp 18  Ht 5\' 5"  (1.651 m)  Wt 179 lb (81.194 kg)  BMI 29.79 kg/m2  SpO2 100%  LMP 08/13/2012  Physical Exam  Nursing note and vitals reviewed. Constitutional: She is oriented to person, place, and time. She appears well-developed and  well-nourished.  HENT:  Head: Normocephalic and atraumatic.  Eyes: Conjunctivae normal and EOM are normal. No scleral icterus.  Neck: Neck supple. No thyromegaly present.  Cardiovascular: Normal rate, regular rhythm and normal heart sounds.  Exam reveals no gallop and no friction rub.   No murmur heard. Pulmonary/Chest: Effort normal and breath sounds normal. No stridor. She has no wheezes. She has no rales. She exhibits no tenderness.  Abdominal: Soft. Bowel sounds are normal. She exhibits no distension. There is no tenderness. There is no rebound.  Musculoskeletal: Normal range of motion. She exhibits no edema.  Lymphadenopathy:    She has no cervical adenopathy.  Neurological: She  is oriented to person, place, and time. Coordination normal.  Skin: Skin is warm and dry. No rash noted. No erythema.  Psychiatric: She has a normal mood and affect. Her behavior is normal.    ED Course  Procedures (including critical care time)  DIAGNOSTIC STUDIES: Oxygen Saturation is 100% on room air, normal by my interpretation.    COORDINATION OF CARE:  9:19 PM: Discussed treatment plan which includes blood work with pt at bedside and pt agreed to plan.     Labs Reviewed - No data to display No results found.   No diagnosis found.    MDM       The chart was scribed for me under my direct supervision.  I personally performed the history, physical, and medical decision making and all procedures in the evaluation of this patient.Benny Lennert, MD 10/19/12 2220

## 2012-10-20 LAB — URINE CULTURE: Colony Count: 25000

## 2012-12-15 ENCOUNTER — Other Ambulatory Visit: Payer: Self-pay | Admitting: Obstetrics & Gynecology

## 2012-12-15 DIAGNOSIS — F1920 Other psychoactive substance dependence, uncomplicated: Secondary | ICD-10-CM

## 2012-12-17 IMAGING — US US OB DETAIL+14 WK
1 series · 14 of 28 positions shown · non-contrast
Comparison: none

[Series 1: us ob detail+14 wk · 14 of 37 slices shown]
[im 2/37]
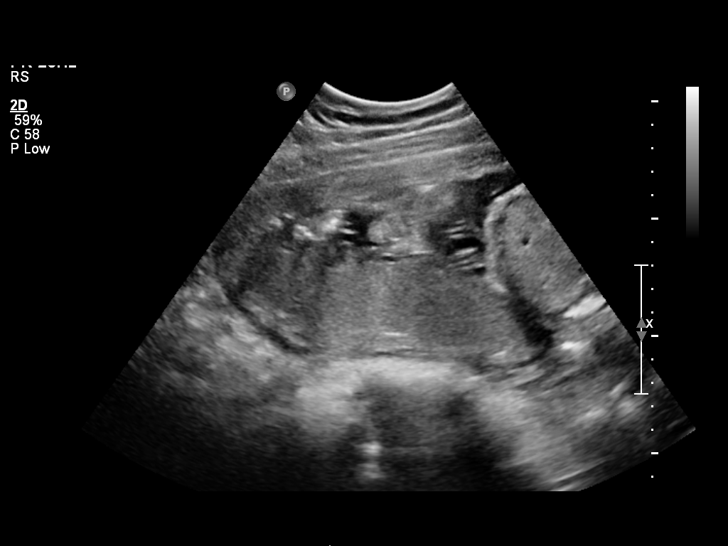
[im 5/37]
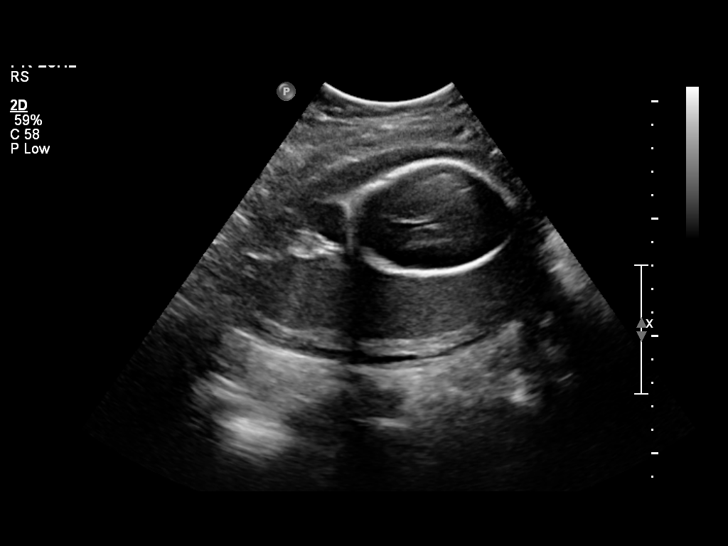
[im 7/37]
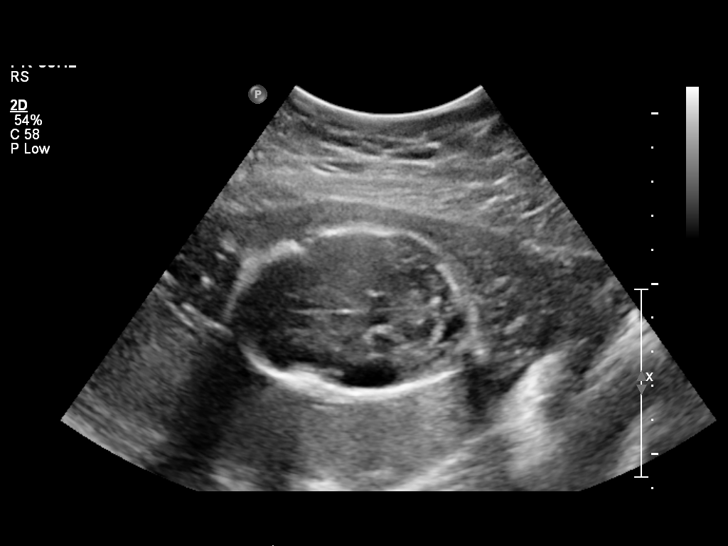
[im 10/37]
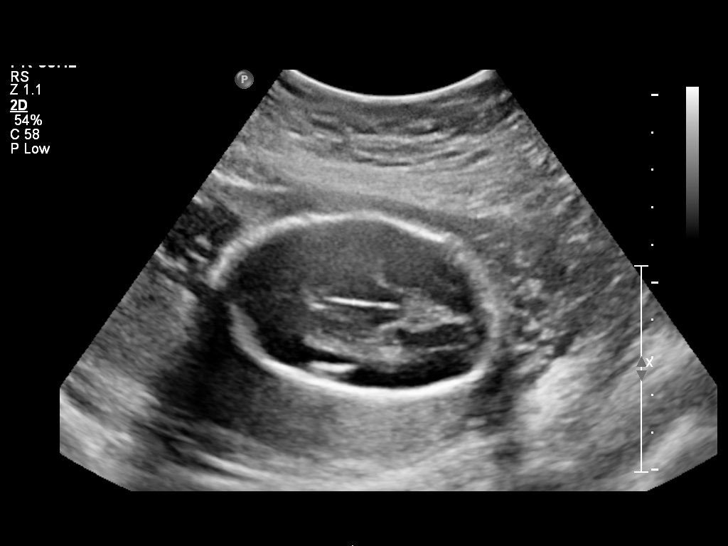
[im 13/37]
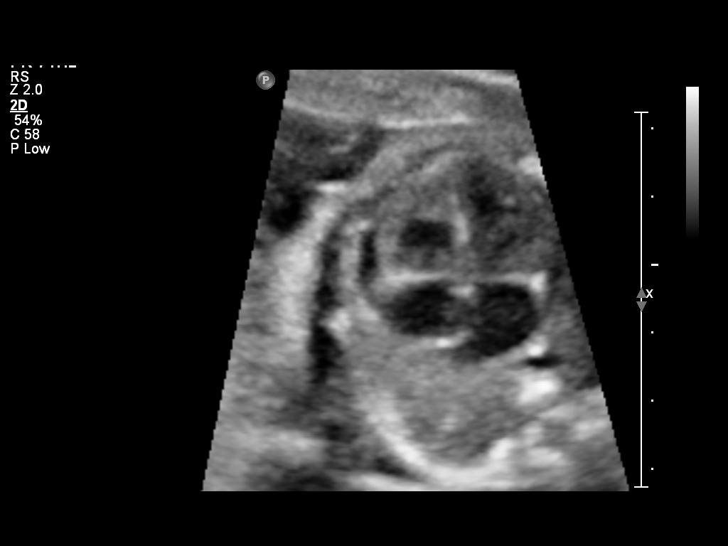
[im 15/37]
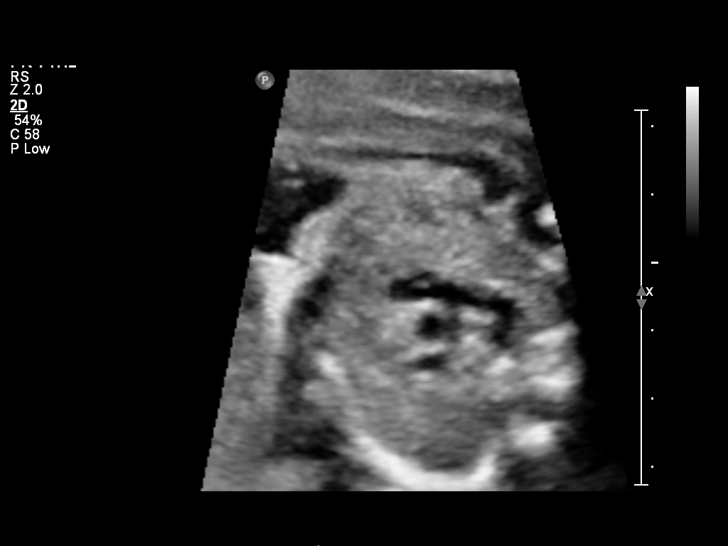
[im 18/37]
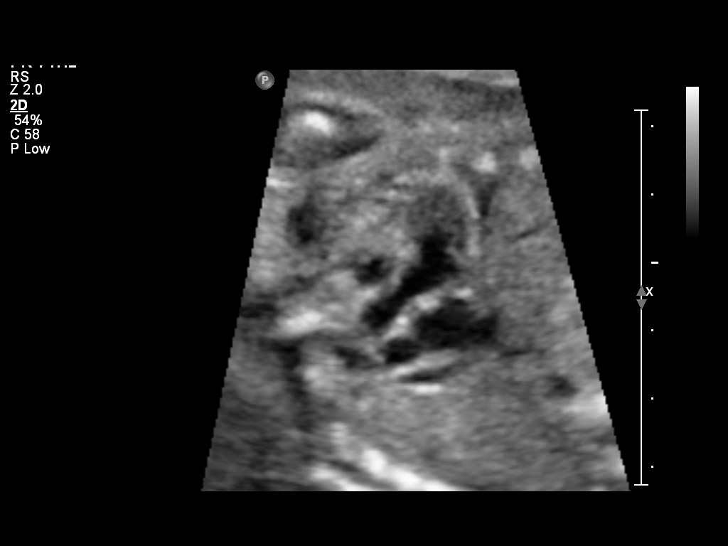
[im 21/37]
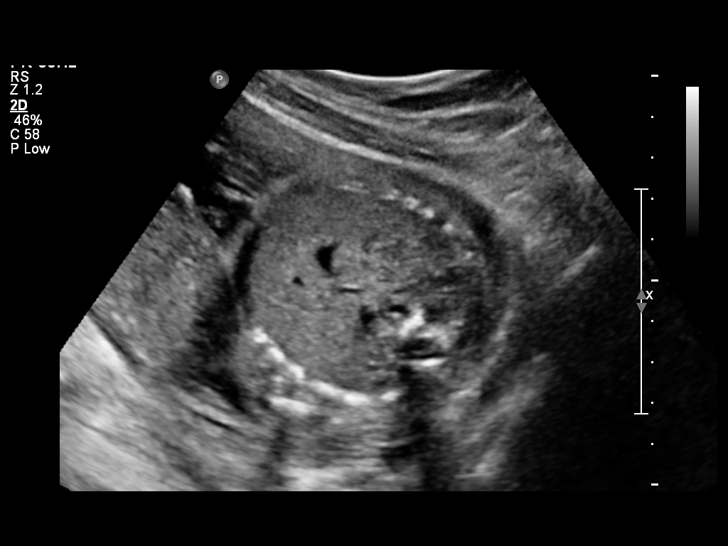
[im 23/37]
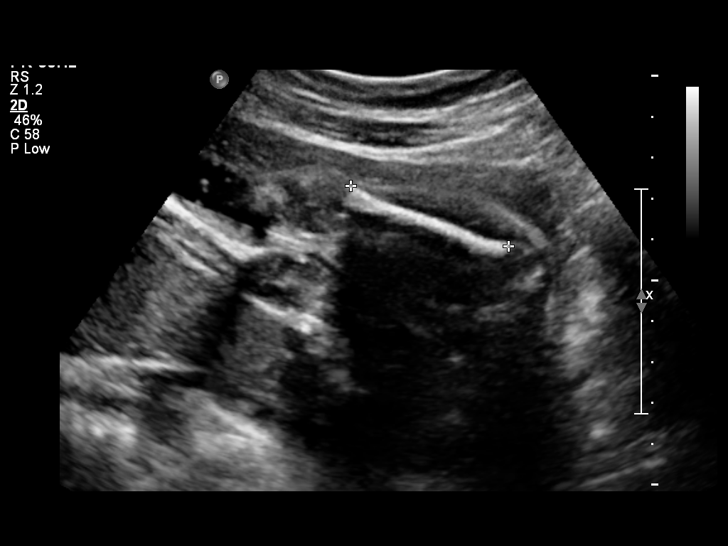
[im 26/37]
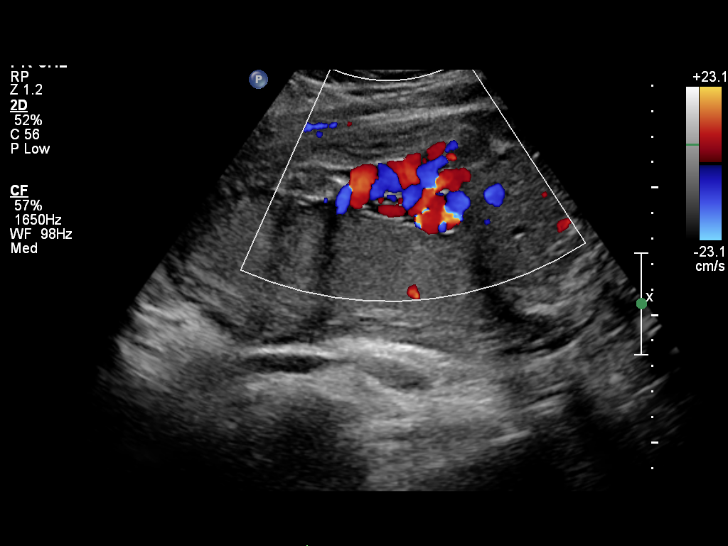
[im 29/37]
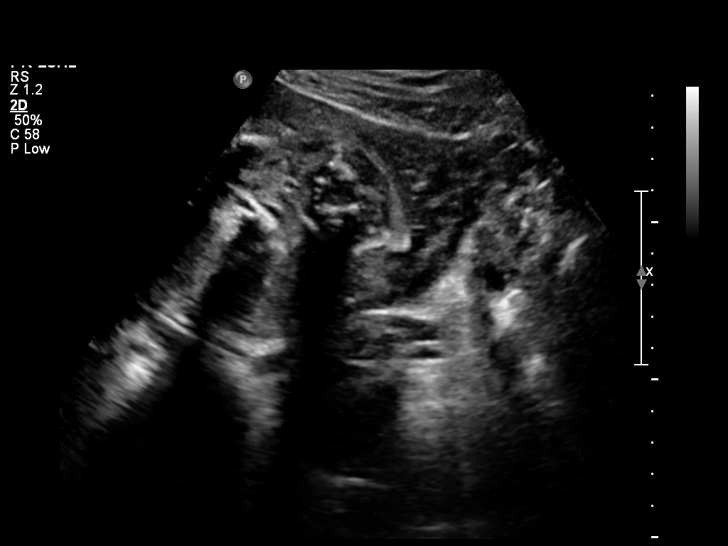
[im 31/37]
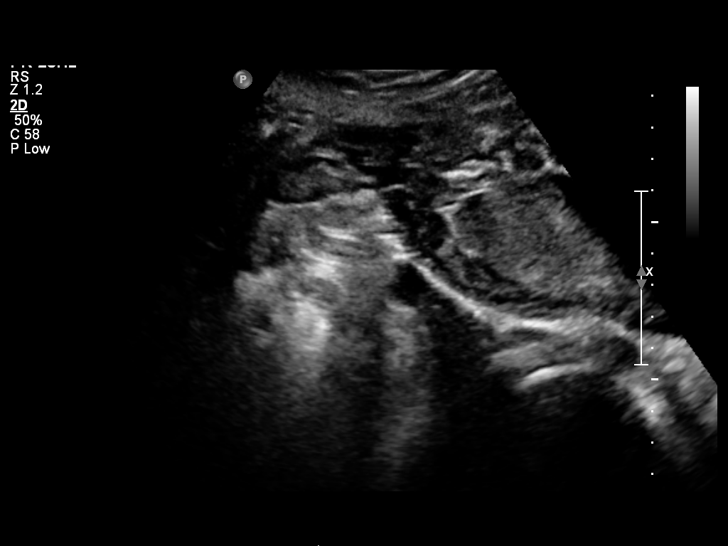
[im 34/37]
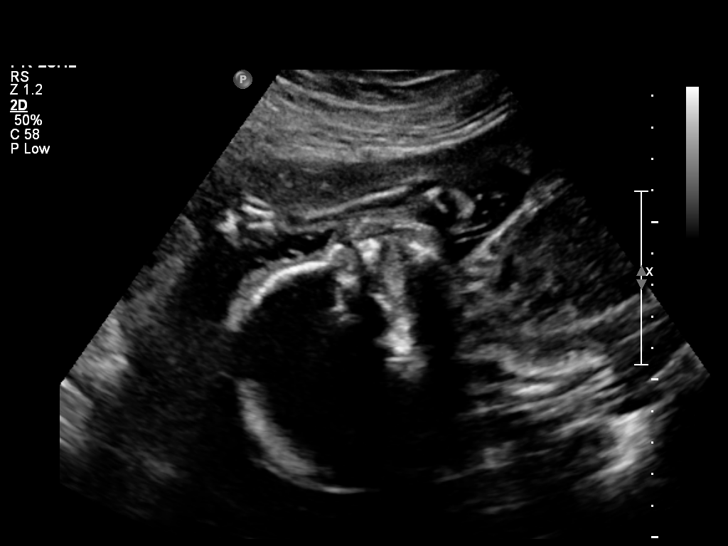
[im 37/37]
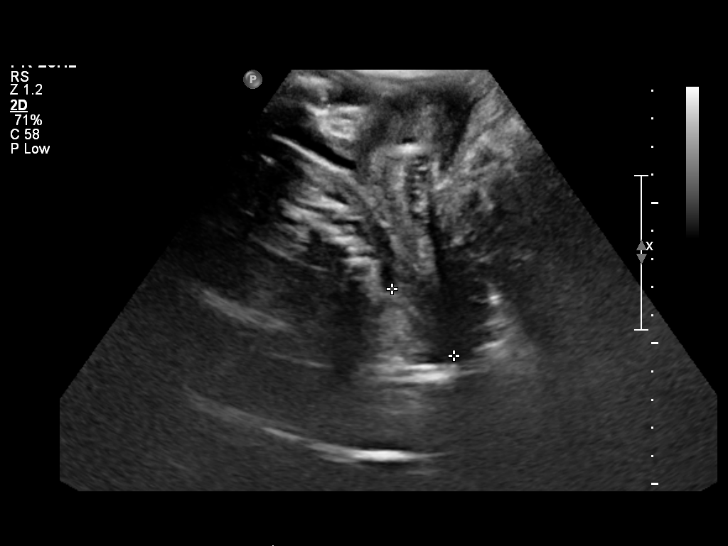

[14 of 28 positions shown; findings below may reference images not displayed]

Canned report from images found in remote index.

Refer to host system for actual result text.

## 2012-12-20 ENCOUNTER — Encounter: Payer: Self-pay | Admitting: *Deleted

## 2012-12-23 ENCOUNTER — Other Ambulatory Visit: Payer: Self-pay | Admitting: *Deleted

## 2012-12-23 ENCOUNTER — Ambulatory Visit (INDEPENDENT_AMBULATORY_CARE_PROVIDER_SITE_OTHER): Payer: Medicaid Other | Admitting: Adult Health

## 2012-12-23 VITALS — BP 120/72 | Ht 65.0 in | Wt 190.0 lb

## 2012-12-23 DIAGNOSIS — Z331 Pregnant state, incidental: Secondary | ICD-10-CM

## 2012-12-23 DIAGNOSIS — Z1389 Encounter for screening for other disorder: Secondary | ICD-10-CM

## 2012-12-23 DIAGNOSIS — O09219 Supervision of pregnancy with history of pre-term labor, unspecified trimester: Secondary | ICD-10-CM

## 2012-12-23 DIAGNOSIS — O09212 Supervision of pregnancy with history of pre-term labor, second trimester: Secondary | ICD-10-CM

## 2012-12-23 DIAGNOSIS — F1920 Other psychoactive substance dependence, uncomplicated: Secondary | ICD-10-CM

## 2012-12-23 DIAGNOSIS — O99320 Drug use complicating pregnancy, unspecified trimester: Secondary | ICD-10-CM

## 2012-12-23 LAB — POCT URINALYSIS DIPSTICK
Bilirubin, UA: NEGATIVE
Glucose, UA: NEGATIVE
Nitrite, UA: NEGATIVE
Urobilinogen, UA: NEGATIVE

## 2012-12-23 MED ORDER — HYDROXYPROGESTERONE CAPROATE 250 MG/ML IM OIL
250.0000 mg | TOPICAL_OIL | Freq: Once | INTRAMUSCULAR | Status: AC
  Administered 2012-12-23: 250 mg via INTRAMUSCULAR

## 2012-12-24 ENCOUNTER — Emergency Department (HOSPITAL_COMMUNITY)
Admission: EM | Admit: 2012-12-24 | Discharge: 2012-12-24 | Disposition: A | Discharge status: Home / Self Care | Payer: Medicaid Other | Attending: Emergency Medicine | Admitting: Emergency Medicine

## 2012-12-24 ENCOUNTER — Encounter (HOSPITAL_COMMUNITY): Payer: Self-pay | Admitting: Emergency Medicine

## 2012-12-24 DIAGNOSIS — O039 Complete or unspecified spontaneous abortion without complication: Secondary | ICD-10-CM

## 2012-12-24 DIAGNOSIS — N898 Other specified noninflammatory disorders of vagina: Secondary | ICD-10-CM | POA: Insufficient documentation

## 2012-12-24 DIAGNOSIS — Z87891 Personal history of nicotine dependence: Secondary | ICD-10-CM | POA: Insufficient documentation

## 2012-12-24 DIAGNOSIS — O9989 Other specified diseases and conditions complicating pregnancy, childbirth and the puerperium: Secondary | ICD-10-CM | POA: Insufficient documentation

## 2012-12-24 DIAGNOSIS — N939 Abnormal uterine and vaginal bleeding, unspecified: Secondary | ICD-10-CM

## 2012-12-24 DIAGNOSIS — R109 Unspecified abdominal pain: Secondary | ICD-10-CM | POA: Insufficient documentation

## 2012-12-24 HISTORY — DX: Complete or unspecified spontaneous abortion without complication: O03.9

## 2012-12-24 LAB — CBC WITH DIFFERENTIAL/PLATELET
Hemoglobin: 12.1 g/dL (ref 12.0–15.0)
Lymphocytes Relative: 21 % (ref 12–46)
Lymphs Abs: 2.5 10*3/uL (ref 0.7–4.0)
Monocytes Relative: 8 % (ref 3–12)
Neutro Abs: 8.3 10*3/uL — ABNORMAL HIGH (ref 1.7–7.7)
Neutrophils Relative %: 70 % (ref 43–77)
Platelets: 272 10*3/uL (ref 150–400)
RBC: 4.38 MIL/uL (ref 3.87–5.11)
WBC: 11.9 10*3/uL — ABNORMAL HIGH (ref 4.0–10.5)

## 2012-12-24 LAB — BASIC METABOLIC PANEL
CO2: 21 mEq/L (ref 19–32)
Chloride: 103 mEq/L (ref 96–112)
GFR calc non Af Amer: >90 mL/min (ref >90)
Glucose, Bld: 105 mg/dL — ABNORMAL HIGH (ref 70–99)
Potassium: 3.1 mEq/L — ABNORMAL LOW (ref 3.5–5.1)
Sodium: 136 mEq/L (ref 135–145)

## 2012-12-24 MED ORDER — ACETAMINOPHEN 500 MG PO TABS
ORAL_TABLET | ORAL | Status: AC
  Filled 2012-12-24: qty 1

## 2012-12-24 MED ORDER — MORPHINE SULFATE 2 MG/ML IJ SOLN
2.0000 mg | Freq: Once | INTRAMUSCULAR | Status: AC
  Administered 2012-12-24: 2 mg via INTRAVENOUS
  Filled 2012-12-24: qty 1

## 2012-12-24 MED ORDER — SODIUM CHLORIDE 0.9 % IV BOLUS (SEPSIS)
1000.0000 mL | Freq: Once | INTRAVENOUS | Status: AC
  Administered 2012-12-24: 1000 mL via INTRAVENOUS

## 2012-12-24 MED ORDER — OXYTOCIN 40 UNITS IN LACTATED RINGERS INFUSION - SIMPLE MED
250.0000 mL/h | INTRAVENOUS | Status: DC
  Administered 2012-12-24: 250 mL/h via INTRAVENOUS
  Filled 2012-12-24: qty 1000

## 2012-12-24 MED ORDER — ONDANSETRON 8 MG PO TBDP
8.0000 mg | ORAL_TABLET | Freq: Once | ORAL | Status: AC
  Administered 2012-12-24: 8 mg via ORAL
  Filled 2012-12-24: qty 1

## 2012-12-24 MED ORDER — OXYTOCIN 40 UNITS IN LACTATED RINGERS INFUSION - SIMPLE MED
INTRAVENOUS | Status: AC
  Filled 2012-12-24: qty 1000

## 2012-12-24 MED ORDER — ACETAMINOPHEN 500 MG PO TABS
1000.0000 mg | ORAL_TABLET | Freq: Once | ORAL | Status: AC
Start: 2012-12-24 — End: 2012-12-24
  Administered 2012-12-24: 1000 mg via ORAL
  Filled 2012-12-24: qty 2

## 2012-12-24 NOTE — ED Notes (Signed)
Patient wants pain medicine. RN made aware.

## 2012-12-24 NOTE — ED Notes (Signed)
Dr. Colon Branch at pt's bedside along with this RN. Pt aware of situation. Pt delivered bloody sac that was then sent to pathology by EDP. This RN then helped clean up pt and provided warm blankets. Pt given something to eat and drink upon request. Friend of pt at bedside. Nad noted at this time.

## 2012-12-24 NOTE — ED Notes (Signed)
Dr. Colon Branch notified of pt's request for pain medication.

## 2012-12-24 NOTE — MAU Provider Note (Signed)
History      CSN: [id number]   Arrival date and time: 12/24/12 0019    None         Chief Complaint   Patient presents with   .  Vaginal Bleeding   .  Abdominal Pain    HPI [name] is a 27 year old female gravida 8 para 60 now 01 71 who has delivered a previable fetus 19 weeks 2 days by known pregnancy criteria. See Dr. strands records of emergency room evaluation and visit. She is now status post delivery, stable emotionally calm,     Past Medical History   Diagnosis  Date   .  Abnormal pap         Past Surgical History   Procedure  Laterality  Date   .  Cesarean section       .  Dilation and curettage of uterus    2011       Family History   Problem  Relation  Age of Onset   .  Hypertension  Other         relation unspecified   .  Cancer  Other         cerviical cancer, relation unspecified       History   Substance Use Topics   .  Smoking status:  Former Smoker -- 1.00 packs/day       Types:  Cigarettes       Quit date:  09/27/2012   .  Smokeless tobacco:  Not on file   .  Alcohol Use:  No      Allergies: No Known Allergies   (Not in a hospital admission)   ROS Physical Exam    Physical Examination: General appearance - alert, well appearing, and in no distress and oriented to person, place, and time Mental status - alert, oriented to person, place, and time, normal mood, behavior, speech, dress, motor activity, and thought processes, patient is amazingly calm asks appropriate questions regarding the etiology of preterm delivery. She denies any recent abuse, illicit substance use. Abdomen - soft, nontender, nondistended, no masses or organomegaly Pelvic - VULVA: normal appearing vulva with no masses, tenderness or lesions, and  No continued bleeding   Blood pressure 111/80, pulse 71, temperature 98.4 F (36.9 C), temperature source Oral, resp. rate 22, last menstrual period 09/11/2012, SpO2 100.00%.   Physical Exam Blood type B-positive  by review of the old records.cbc CBC    Component  Value  Date/Time     WBC  11.9*  12/24/2012 0055     RBC  4.38  12/24/2012 0055     HGB  12.1  12/24/2012 0055     HCT  34.5*  12/24/2012 0055     PLT  272  12/24/2012 0055     MCV  78.8  12/24/2012 0055     MCH  27.6  12/24/2012 0055     MCHC  35.1  12/24/2012 0055     RDW  12.6  12/24/2012 0055     LYMPHSABS  2.5  12/24/2012 0055     MONOABS  0.9  12/24/2012 0055     EOSABS  0.1  12/24/2012 0055     BASOSABS  0.0  12/24/2012 0055         MAU Course    Procedures   MDM Review of noted, evaluation of placenta/products, reassessment    Assessment and Plan    Preterm delivery 19 weeks 2 days, completed Will observe for  2 hours and discharge 8 a.m. for home care Axel Meas V 12/24/2012, 5:51 AM      Stable for discharge at 7"30

## 2012-12-24 NOTE — ED Notes (Signed)
Product of conception taken to lab.

## 2012-12-24 NOTE — ED Notes (Signed)
[facility], she stated the pt is 19weeks and just get a doppler fetal heart rate.

## 2012-12-24 NOTE — ED Provider Notes (Addendum)
History     CSN: [id number]  Arrival date & time 12/24/12  0019   First MD Initiated Contact with Patient 12/24/12 0037      Chief Complaint  Patient presents with  . Vaginal Bleeding  . Abdominal Pain    (Consider location/radiation/quality/duration/timing/severity/associated sxs/prior treatment) HPI Megan Stevens is a 27 y.o. female  Who is a G8, A6, P1  (born at 24 weeks) and is 19 weeks 2 days pregnant who presents to the Emergency Department complaining of abdominal cramping and vaginal bleeding that began this afternoon. She is receiving hydroxyprogesterone 250 mg IM weekly having received her shot today at Dr. Rayna Sexton office.    OB/GYN  Dr. Emelda Fear Past Medical History  Diagnosis Date  . Abnormal pap     Past Surgical History  Procedure Laterality Date  . Cesarean section    . Dilation and curettage of uterus  2011    Family History  Problem Relation Age of Onset  . Hypertension Other     relation unspecified  . Cancer Other     cerviical cancer, relation unspecified    History  Substance Use Topics  . Smoking status: Former Smoker -- 1.00 packs/day    Types: Cigarettes    Quit date: 09/27/2012  . Smokeless tobacco: Not on file  . Alcohol Use: No    OB History   Grav Para Term Preterm Abortions TAB SAB Ect Mult Living   6 1  1 4  4   1       Review of Systems  Constitutional: Negative for fever.       10 Systems reviewed and are negative for acute change except as noted in the HPI.  HENT: Negative for congestion.   Eyes: Negative for discharge and redness.  Respiratory: Negative for cough and shortness of breath.   Cardiovascular: Negative for chest pain.  Gastrointestinal: Positive for abdominal pain. Negative for vomiting.  Genitourinary: Positive for vaginal bleeding.  Musculoskeletal: Negative for back pain.  Skin: Negative for rash.  Neurological: Negative for syncope, numbness and headaches.  Psychiatric/Behavioral:       No  behavior change.    Allergies  Review of patient's allergies indicates no known allergies.  Home Medications   Current Outpatient Rx  Name  Route  Sig  Dispense  Refill  . hydroxyprogesterone caproate (DELALUTIN) 250 mg/mL OIL   Intramuscular   Inject 250 mg into the muscle once a week.         [name] ibuprofen (ADVIL,MOTRIN) 200 MG tablet   Oral   Take 400 mg by mouth daily as needed. For occasional pain/headache pain         . ondansetron (ZOFRAN) 8 MG tablet   Oral   Take 8 mg by mouth every 8 (eight) hours as needed for nausea.         . Prenatal Vit-Fe Fumarate-FA (PRENATAL VITAMINS) 28-0.8 MG TABS   Oral   Take 1 tablet by mouth daily.   30 tablet   3   . EXPIRED: promethazine (PHENERGAN) 25 MG tablet   Oral   Take 1 tablet (25 mg total) by mouth every 6 (six) hours as needed for nausea.   20 tablet   0     BP 119/74  Pulse 88  Temp(Src) 98.4 F (36.9 C) (Oral)  Resp 22  SpO2 100%  LMP 09/11/2012  Physical Exam  Nursing note and vitals reviewed. Constitutional: She appears well-developed and well-nourished.  Awake, alert, nontoxic appearance.  HENT:  Head: Normocephalic and atraumatic.  Right Ear: External ear normal.  Left Ear: External ear normal.  Eyes: Right eye exhibits no discharge. Left eye exhibits no discharge.  Neck: Neck supple.  Cardiovascular: Normal rate and intact distal pulses.   Pulmonary/Chest: Effort normal and breath sounds normal. She exhibits no tenderness.  Abdominal: Soft. Bowel sounds are normal. There is no tenderness. There is no rebound.  Genitourinary:  Gravid uterus. Vaginal exam with speculum without blood in the cervical os  Which is closed or in the vaginal vault. Blood is on the exterior surface of he vagina and over both mons.   Musculoskeletal: She exhibits no tenderness.  Baseline ROM, no obvious new focal weakness.  Neurological:  Mental status and motor strength appears baseline for patient and situation.   Skin: No rash noted.  Psychiatric: She has a normal mood and affect.    ED Course  Procedures (including critical care time) Results for orders placed during the hospital encounter of 12/24/12  CBC WITH DIFFERENTIAL      Result Value Range   WBC 11.9 (*) 4.0 - 10.5 K/uL   RBC 4.38  3.87 - 5.11 MIL/uL   Hemoglobin 12.1  12.0 - 15.0 g/dL   HCT 95.6 (*) 21.3 - 08.6 %   MCV 78.8  78.0 - 100.0 fL   MCH 27.6  26.0 - 34.0 pg   MCHC 35.1  30.0 - 36.0 g/dL   RDW 57.8  46.9 - 62.9 %   Platelets 272  150 - 400 K/uL   Neutrophils Relative 70  43 - 77 %   Neutro Abs 8.3 (*) 1.7 - 7.7 K/uL   Lymphocytes Relative 21  12 - 46 %   Lymphs Abs 2.5  0.7 - 4.0 K/uL   Monocytes Relative 8  3 - 12 %   Monocytes Absolute 0.9  0.1 - 1.0 K/uL   Eosinophils Relative 1  0 - 5 %   Eosinophils Absolute 0.1  0.0 - 0.7 K/uL   Basophils Relative 0  0 - 1 %   Basophils Absolute 0.0  0.0 - 0.1 K/uL  BASIC METABOLIC PANEL      Result Value Range   Sodium 136  135 - 145 mEq/L   Potassium 3.1 (*) 3.5 - 5.1 mEq/L   Chloride 103  96 - 112 mEq/L   CO2 21  19 - 32 mEq/L   Glucose, Bld 105 (*) 70 - 99 mg/dL   BUN 8  6 - 23 mg/dL   Creatinine, Ser 5.28  0.50 - 1.10 mg/dL   Calcium 9.1  8.4 - 41.3 mg/dL   GFR calc non Af Amer >90  >90 mL/min   GFR calc Af Amer >90  >90 mL/min   0045 FHT 145-148 by doppler 0355 Patient up to the bathroom. Returned to room stating there was "something coming out".  Patient examined. She is in the process of delivery, with amniotic sac present at vaginal os. 0422 Vaginal delivery of dead fetus and placenta. No fetal heart beat present . Fully formed female child with cord wrapped around neck. Placenta is torn. Placed all products of conception in bucket for pathology.  4:46 AM:  T/C to Dr. Emelda Fear, case discussed, including:  HPI, pertinent PM/SHx, VS/PE, dx testing, ED course and treatment.  Agreeable to admission. He will come in to the ER.    MDM  Patient with high risk  pregnancy G8, A6, P1 with blood present and abdominal cramping. Labs  are normal. Vaginal exam without blood in the vaginal vault or at the cervical os. Blood is over the mons.Given morphine and zofran. Given IVF. She received hydroxyprogesterone yesterday at Dr. Rayna Sexton office.  Went to the bathroom and returned with vaginal delivery in process. Assisted with delivery of dead fetus and placenta. Pt stable in ED with no significant deterioration in condition.The patient appears reasonably stabilized for admission considering the current resources, flow, and capabilities available in the ED at this time, and I doubt any other [facility] requiring further screening and/or treatment in the ED prior to admission.    MDM Reviewed: nursing note and vitals Interpretation: labs  CRITICAL CARE Performed by: Annamarie Dawley Total critical care time: 50 Critical care time was exclusive of separately billable procedures and treating other patients. Critical care was necessary to treat or prevent imminent or life-threatening deterioration. Critical care was time spent personally by me on the following activities: development of treatment plan with patient and/or surrogate as well as nursing, discussions with consultants, evaluation of patient's response to treatment, examination of patient, obtaining history from patient or surrogate, ordering and performing treatments and interventions, ordering and review of laboratory studies, ordering and review of radiographic studies, pulse oximetry and re-evaluation of patient's condition. care      Nicoletta Dress. Colon Branch, MD 12/24/12 [phone]

## 2012-12-24 NOTE — ED Notes (Signed)
Pt states she is five months pregnant. Got shot today to prevent premature labor as previous pregnancy was premature. She states she started bleeding and c/o abdominal cramping.

## 2012-12-24 NOTE — ED Notes (Signed)
Small amount of blood noted on sheet and chuck under pt at time of linen change.

## 2012-12-24 NOTE — ED Notes (Signed)
Pt was wheeled to the bathroom, when this RN went to take her back to her room, pt stated she thought her baby was crowning. Pt was wheeled back to her room and this RN looked at the vagina. Nothing was noted until the pt bore down and a bloody sac protruded from the vaginal area. Dr. Colon Branch notified.

## 2012-12-24 NOTE — ED Notes (Signed)
Dr. Ferguson at bedside. 

## 2012-12-24 NOTE — ED Notes (Signed)
Patient assisted to the restroom and back to room. Tolerated well. 

## 2012-12-27 ENCOUNTER — Telehealth: Payer: Self-pay | Admitting: Obstetrics and Gynecology

## 2012-12-27 ENCOUNTER — Telehealth: Payer: Self-pay | Admitting: Obstetrics & Gynecology

## 2012-12-27 NOTE — Telephone Encounter (Signed)
Sorry, I do not know who this pt is and I think maybe was sent to me in error; I know you have just gone live with Epic and not sure if you are looking for another carol,

## 2012-12-27 NOTE — Telephone Encounter (Signed)
Spoke with patient. Pt miscarried and wants milk to dry up. Advised to wear a bra about one size to small and put cold cabbage leaves in bra. Advised it may take a couple days but that should help.

## 2012-12-30 ENCOUNTER — Encounter: Payer: Self-pay | Admitting: Obstetrics & Gynecology

## 2012-12-30 ENCOUNTER — Ambulatory Visit: Payer: Self-pay

## 2012-12-30 NOTE — Telephone Encounter (Signed)
Please inform patient that leaking breasts after delivery can last for weeks, and shouldn't be a concern for at least 3 months.

## 2013-01-06 ENCOUNTER — Encounter: Payer: Self-pay | Admitting: Advanced Practice Midwife

## 2013-01-06 ENCOUNTER — Other Ambulatory Visit: Payer: Self-pay

## 2013-01-07 ENCOUNTER — Ambulatory Visit: Payer: Self-pay | Admitting: Obstetrics and Gynecology

## 2013-01-25 ENCOUNTER — Encounter (HOSPITAL_COMMUNITY): Payer: Self-pay | Admitting: Emergency Medicine

## 2013-01-25 ENCOUNTER — Emergency Department (HOSPITAL_COMMUNITY)
Admission: EM | Admit: 2013-01-25 | Discharge: 2013-01-25 | Disposition: A | Discharge status: Home / Self Care | Payer: Medicaid Other | Attending: Emergency Medicine | Admitting: Emergency Medicine

## 2013-01-25 DIAGNOSIS — R1110 Vomiting, unspecified: Secondary | ICD-10-CM

## 2013-01-25 DIAGNOSIS — Z87891 Personal history of nicotine dependence: Secondary | ICD-10-CM | POA: Insufficient documentation

## 2013-01-25 DIAGNOSIS — Z3202 Encounter for pregnancy test, result negative: Secondary | ICD-10-CM | POA: Insufficient documentation

## 2013-01-25 DIAGNOSIS — R112 Nausea with vomiting, unspecified: Secondary | ICD-10-CM | POA: Insufficient documentation

## 2013-01-25 LAB — CBC WITH DIFFERENTIAL/PLATELET
Eosinophils Relative: 1 % (ref 0–5)
HCT: 38.3 % (ref 36.0–46.0)
Lymphocytes Relative: 20 % (ref 12–46)
Lymphs Abs: 1.9 10*3/uL (ref 0.7–4.0)
MCV: 80.5 fL (ref 78.0–100.0)
Monocytes Absolute: 0.6 10*3/uL (ref 0.1–1.0)
Neutro Abs: 7 10*3/uL (ref 1.7–7.7)
Platelets: 296 10*3/uL (ref 150–400)
RBC: 4.76 MIL/uL (ref 3.87–5.11)
WBC: 9.7 10*3/uL (ref 4.0–10.5)

## 2013-01-25 LAB — COMPREHENSIVE METABOLIC PANEL
ALT: 10 U/L (ref 0–35)
CO2: 25 mEq/L (ref 19–32)
Calcium: 8.6 mg/dL (ref 8.4–10.5)
Chloride: 107 mEq/L (ref 96–112)
GFR calc Af Amer: >90 mL/min (ref >90)
GFR calc non Af Amer: >90 mL/min (ref >90)
Glucose, Bld: 88 mg/dL (ref 70–99)
Sodium: 141 mEq/L (ref 135–145)
Total Bilirubin: 0.3 mg/dL (ref 0.3–1.2)

## 2013-01-25 LAB — URINALYSIS, ROUTINE W REFLEX MICROSCOPIC
Bilirubin Urine: NEGATIVE
Glucose, UA: NEGATIVE mg/dL
Hgb urine dipstick: NEGATIVE
Specific Gravity, Urine: 1.015 (ref 1.005–1.030)
pH: 7.5 (ref 5.0–8.0)

## 2013-01-25 LAB — URINE MICROSCOPIC-ADD ON

## 2013-01-25 MED ORDER — KETOROLAC TROMETHAMINE 30 MG/ML IJ SOLN
30.0000 mg | Freq: Once | INTRAMUSCULAR | Status: AC
  Administered 2013-01-25: 30 mg via INTRAVENOUS
  Filled 2013-01-25: qty 1

## 2013-01-25 MED ORDER — SODIUM CHLORIDE 0.9 % IV BOLUS (SEPSIS)
1000.0000 mL | Freq: Once | INTRAVENOUS | Status: AC
  Administered 2013-01-25: 1000 mL via INTRAVENOUS

## 2013-01-25 MED ORDER — ONDANSETRON HCL 8 MG PO TABS: 8.0000 mg | ORAL_TABLET | Freq: Three times a day (TID) | ORAL | Status: DC | PRN

## 2013-01-25 MED ORDER — ONDANSETRON HCL 4 MG/2ML IJ SOLN
4.0000 mg | Freq: Once | INTRAMUSCULAR | Status: AC
  Administered 2013-01-25: 4 mg via INTRAVENOUS
  Filled 2013-01-25: qty 2

## 2013-01-25 NOTE — ED Notes (Signed)
Pt states vomiting on & off that started 3 days ago. Pt states she has been able to keep fluids downs. Vomited last about 1 hour ago. Pt also states some diarrhea.

## 2013-01-25 NOTE — ED Notes (Signed)
Pt c/o intermittent n/v x 2 days. Some diarrhea. Denies abd pain.

## 2013-01-25 NOTE — ED Provider Notes (Signed)
History     CSN: [id number]  Arrival date & time 01/25/13  1751   First MD Initiated Contact with Patient 01/25/13 1924      Chief Complaint  Patient presents with  . Emesis    (Consider location/radiation/quality/duration/timing/severity/associated sxs/prior treatment) HPI Comments: Patient with 4 day history of being unable to keep anything down.  No abd pain, fever, or chills.  No ill contacts.  She reports she had a miscarriage one month ago when she was [redacted] weeks pregnant.  Denies possibility of pregnancy now.  No discharge, bleeding, or lower abd pain.  Patient is a 27 y.o. female presenting with vomiting. The history is provided by the patient.  Emesis Severity:  Moderate Duration:  3 days Timing:  Constant Quality:  Stomach contents Able to tolerate:  Liquids and solids Progression:  Unchanged Chronicity:  New Recent urination:  Decreased Relieved by:  Nothing Worsened by:  Nothing tried Ineffective treatments:  None tried Associated symptoms: no abdominal pain, no chills, no diarrhea and no fever     Past Medical History  Diagnosis Date  . Abnormal pap     Past Surgical History  Procedure Laterality Date  . Cesarean section    . Dilation and curettage of uterus  2011    Family History  Problem Relation Age of Onset  . Hypertension Other     relation unspecified  . Cancer Other     cerviical cancer, relation unspecified    History  Substance Use Topics  . Smoking status: Former Smoker -- 1.00 packs/day    Types: Cigarettes    Quit date: 09/27/2012  . Smokeless tobacco: Not on file  . Alcohol Use: No    OB History   Grav Para Term Preterm Abortions TAB SAB Ect Mult Living   6 1  1 4  4   1       Review of Systems  Constitutional: Negative for chills.  Gastrointestinal: Positive for vomiting. Negative for abdominal pain and diarrhea.  All other systems reviewed and are negative.    Allergies  Review of patient's allergies indicates no  known allergies.  Home Medications  No current outpatient prescriptions on file.  BP 118/66  Pulse 87  Temp(Src) 99.2 F (37.3 C) (Oral)  Resp 18  Ht 5\' 5"  (1.651 m)  Wt 180 lb (81.647 kg)  BMI 29.95 kg/m2  SpO2 100%  LMP 09/11/2012  Breastfeeding? Unknown  Physical Exam  Nursing note and vitals reviewed. Constitutional: She is oriented to person, place, and time. She appears well-developed and well-nourished. No distress.  HENT:  Head: Normocephalic and atraumatic.  Neck: Normal range of motion. Neck supple.  Cardiovascular: Normal rate and regular rhythm.  Exam reveals no gallop and no friction rub.   No murmur heard. Pulmonary/Chest: Effort normal and breath sounds normal. No respiratory distress. She has no wheezes.  Abdominal: Soft. Bowel sounds are normal. She exhibits no distension. There is no tenderness.  Musculoskeletal: Normal range of motion.  Neurological: She is alert and oriented to person, place, and time.  Skin: Skin is warm and dry. She is not diaphoretic.    ED Course  Procedures (including critical care time)  Labs Reviewed  URINALYSIS, ROUTINE W REFLEX MICROSCOPIC  PREGNANCY, URINE  CBC WITH DIFFERENTIAL  COMPREHENSIVE METABOLIC PANEL   No results found.   No diagnosis found.    MDM  Patient presents with nausea, vomiting and intolerance of po liquids and solids.  She does not appear clinically dehydrated  and labs are basically unremarkable.  She is not pregnant.  She is feeling better with hydration and meds.  She will be discharged with nausea meds, return prn.        Geoffery Lyons, MD 01/25/13 2043

## 2013-01-25 NOTE — ED Notes (Signed)
Pt alert & oriented x4, stable gait. Patient given discharge instructions, paperwork & prescription(s). Patient  instructed to stop at the registration desk to finish any additional paperwork. Patient verbalized understanding. Pt left department w/ no further questions. 

## 2013-01-26 LAB — URINE CULTURE: Colony Count: 30000

## 2013-02-22 ENCOUNTER — Emergency Department (HOSPITAL_COMMUNITY)
Admission: EM | Admit: 2013-02-22 | Discharge: 2013-02-22 | Disposition: A | Discharge status: Home / Self Care | Payer: Medicaid Other | Attending: Emergency Medicine | Admitting: Emergency Medicine

## 2013-02-22 ENCOUNTER — Encounter (HOSPITAL_COMMUNITY): Payer: Self-pay | Admitting: *Deleted

## 2013-02-22 DIAGNOSIS — M25579 Pain in unspecified ankle and joints of unspecified foot: Secondary | ICD-10-CM | POA: Insufficient documentation

## 2013-02-22 DIAGNOSIS — M79672 Pain in left foot: Secondary | ICD-10-CM

## 2013-02-22 DIAGNOSIS — Z87891 Personal history of nicotine dependence: Secondary | ICD-10-CM | POA: Insufficient documentation

## 2013-02-22 MED ORDER — DICLOFENAC SODIUM 75 MG PO TBEC: 75.0000 mg | DELAYED_RELEASE_TABLET | Freq: Two times a day (BID) | ORAL | Status: DC

## 2013-02-22 MED ORDER — HYDROCODONE-ACETAMINOPHEN 5-325 MG PO TABS: 1.0000 | ORAL_TABLET | ORAL | Status: DC | PRN

## 2013-02-22 NOTE — ED Notes (Signed)
Pt with left ankle pain, slight swelling noted, denies injury to left ankle, states pain is constant but worse with ambulating

## 2013-02-22 NOTE — ED Provider Notes (Signed)
History     CSN: [id number]  Arrival date & time 02/22/13  1810   First MD Initiated Contact with Patient 02/22/13 1910      Chief Complaint  Patient presents with  . Ankle Pain    (Consider location/radiation/quality/duration/timing/severity/associated sxs/prior treatment) Patient is a 27 y.o. female presenting with ankle pain. The history is provided by the patient.  Ankle Pain Location:  Ankle Ankle location:  L ankle Pain details:    Quality:  Aching   Radiates to:  Does not radiate   Severity:  Moderate   Onset quality:  Gradual   Duration:  1 week   Timing:  Intermittent   Progression:  Worsening Chronicity:  New Dislocation: no   Foreign body present:  No foreign bodies Prior injury to area:  No Relieved by:  Nothing Worsened by:  Bearing weight Ineffective treatments:  None tried Associated symptoms: decreased ROM and swelling   Associated symptoms: no back pain, no neck pain and no numbness   Risk factors: no frequent fractures and no recent illness     Past Medical History  Diagnosis Date  . Abnormal pap     Past Surgical History  Procedure Laterality Date  . Cesarean section    . Dilation and curettage of uterus  2011    Family History  Problem Relation Age of Onset  . Hypertension Other     relation unspecified  . Cancer Other     cerviical cancer, relation unspecified    History  Substance Use Topics  . Smoking status: Former Smoker -- 1.00 packs/day    Types: Cigarettes    Quit date: 09/27/2012  . Smokeless tobacco: Not on file  . Alcohol Use: No    OB History   Grav Para Term Preterm Abortions TAB SAB Ect Mult Living   6 1  1 4  4   1       Review of Systems  Constitutional: Negative for activity change.       All ROS Neg except as noted in HPI  HENT: Negative for nosebleeds and neck pain.   Eyes: Negative for photophobia and discharge.  Respiratory: Negative for cough, shortness of breath and wheezing.   Cardiovascular:  Negative for chest pain and palpitations.  Gastrointestinal: Negative for abdominal pain and blood in stool.  Genitourinary: Negative for dysuria, frequency and hematuria.  Musculoskeletal: Negative for back pain and arthralgias.  Skin: Negative.   Neurological: Negative for dizziness, seizures and speech difficulty.  Psychiatric/Behavioral: Negative for hallucinations and confusion.    Allergies  Review of patient's allergies indicates not on file.  Home Medications  No current outpatient prescriptions on file.  BP 115/80  Pulse 86  Temp(Src) 98.8 F (37.1 C) (Oral)  Resp 18  Ht 5\' 5"  (1.651 m)  Wt 185 lb (83.915 kg)  BMI 30.79 kg/m2  SpO2 100%  LMP 01/29/2013  Breastfeeding? No  Physical Exam  Nursing note and vitals reviewed. Constitutional: She is oriented to person, place, and time. She appears well-developed and well-nourished.  Non-toxic appearance.  HENT:  Head: Normocephalic.  Right Ear: Tympanic membrane and external ear normal.  Left Ear: Tympanic membrane and external ear normal.  Eyes: EOM and lids are normal. Pupils are equal, round, and reactive to light.  Neck: Normal range of motion. Neck supple. Carotid bruit is not present.  Cardiovascular: Normal rate, regular rhythm, normal heart sounds, intact distal pulses and normal pulses.   Pulmonary/Chest: Breath sounds normal. No respiratory distress.  Abdominal: Soft. Bowel sounds are normal. There is no tenderness. There is no guarding.  Musculoskeletal: She exhibits tenderness. She exhibits no edema.       Feet:  Lymphadenopathy:       Head (right side): No submandibular adenopathy present.       Head (left side): No submandibular adenopathy present.    She has no cervical adenopathy.  Neurological: She is alert and oriented to person, place, and time. She has normal strength. No cranial nerve deficit or sensory deficit.  Skin: Skin is warm and dry.  Psychiatric: She has a normal mood and affect. Her  speech is normal.    ED Course  Procedures (including critical care time)  Labs Reviewed - No data to display No results found.   No diagnosis found.    MDM  I have reviewed nursing notes, vital signs, and all appropriate lab and imaging results for this patient. Pt is tender at the plantar surface of the mid foot, and the dorsum of the left ankle. No hot areas. No effusion. Suspect sprain and inflammation of the tendons of the mid foot. Plan: to use shoes with better support. Rx for diclofenac and norco given. Pt to see podiatry for evaluation.       Kathie Dike, PA-C 02/23/13 [phone]

## 2013-02-22 NOTE — ED Notes (Signed)
Pain lt ankle for 1 week, no injury.

## 2013-02-25 NOTE — ED Provider Notes (Signed)
Medical screening examination/treatment/procedure(s) were performed by non-physician practitioner and as supervising physician I was immediately available for consultation/collaboration.   Benny Lennert, MD 02/25/13 [phone]

## 2013-03-15 ENCOUNTER — Telehealth: Payer: Self-pay | Admitting: Adult Health

## 2013-03-15 NOTE — Telephone Encounter (Signed)
Spoke with pt. Delivered baby early in March but never came in for postpartum visit. Having abnormal bleeding. Pt has had unprotected sex. Advised to do a pregnancy test. Pt would rather come here for test. Appt scheduled for postpartum visit and pregnancy test. [name]

## 2013-03-17 ENCOUNTER — Ambulatory Visit (INDEPENDENT_AMBULATORY_CARE_PROVIDER_SITE_OTHER): Payer: Medicaid Other | Admitting: Adult Health

## 2013-03-17 ENCOUNTER — Encounter: Payer: Self-pay | Admitting: Adult Health

## 2013-03-17 VITALS — BP 112/68 | Ht 65.0 in | Wt 176.0 lb

## 2013-03-17 DIAGNOSIS — N926 Irregular menstruation, unspecified: Secondary | ICD-10-CM

## 2013-03-17 DIAGNOSIS — Z3009 Encounter for other general counseling and advice on contraception: Secondary | ICD-10-CM

## 2013-03-17 DIAGNOSIS — O99345 Other mental disorders complicating the puerperium: Secondary | ICD-10-CM | POA: Insufficient documentation

## 2013-03-17 DIAGNOSIS — F53 Postpartum depression: Secondary | ICD-10-CM

## 2013-03-17 DIAGNOSIS — Z3200 Encounter for pregnancy test, result unknown: Secondary | ICD-10-CM

## 2013-03-17 DIAGNOSIS — Z3202 Encounter for pregnancy test, result negative: Secondary | ICD-10-CM

## 2013-03-17 LAB — POCT URINE PREGNANCY: Preg Test, Ur: NEGATIVE

## 2013-03-17 MED ORDER — ESCITALOPRAM OXALATE 10 MG PO TABS: 10.0000 mg | ORAL_TABLET | Freq: Every day | ORAL | Status: DC

## 2013-03-17 NOTE — Patient Instructions (Addendum)
Start lexapro today See [facility] Call with [facility]enses for nexplanon Return in 2 weeks follow up depression

## 2013-03-17 NOTE — Progress Notes (Signed)
Subjective:     Patient ID: Megan Stevens, female   DOB: 1986/07/08, 27 y.o.   MRN: [id number]  HPI Megan Stevens is a 27 year old black female in today with complaints of irregular bleeding.She delivered a 19+1 week previable baby 12/24/12 and has not been seen since. She has had sex, and used withdrawal.  Review of Systems Patient denies any headaches, blurred vision, shortness of breath, chest pain, abdominal pain, problems with bowel movements, urination, or intercourse. No joint pain, she seems happy but her depression score was 21.And she needs birth control.  Reviewed past medical,surgical, social and family history. Reviewed medications and allergies.     Objective:   Physical Exam BP 112/68  Ht 5\' 5"  (1.651 m)  Wt 176 lb (79.833 kg)  BMI 29.29 kg/m2  LMP 04/26/2014urine pregnancy test negative. Skin warm and dry.Pelvic: external genitalia is normal in appearance, vagina: white discharge,no blood seen, cervix:smooth and bulbous, uterus: normal size, shape and contour, non tender, no masses felt, adnexa: no masses or tenderness noted. GC/CHL obtained.     Assessment:    Irregular bleeding  Postpartum depression Contraceptive counselling   s/p second trimester miscarriage  Plan:    [facility] to [facility] Return in 2 weeks for follow up  Rx lexapro 10 mg 1 daily, start today   Call with menses to have nexplanon inserted.

## 2013-03-21 ENCOUNTER — Telehealth: Payer: Self-pay | Admitting: Obstetrics and Gynecology

## 2013-03-21 NOTE — Telephone Encounter (Signed)
Pt aware of results 

## 2013-03-31 ENCOUNTER — Ambulatory Visit: Payer: Medicaid Other | Admitting: Adult Health

## 2013-03-31 ENCOUNTER — Telehealth: Payer: Self-pay | Admitting: Obstetrics and Gynecology

## 2013-03-31 NOTE — Telephone Encounter (Signed)
Spoke with pt. Has had a period 3 times this month. They last for 4 days. Not on any birth control. Having some cramping also. No new sexual partner. Has an appt on July 1. Unable to get her in sooner. Advised to keep that appt. Pt voiced understanding. [name]

## 2013-04-05 ENCOUNTER — Ambulatory Visit: Payer: Medicaid Other | Admitting: Adult Health

## 2013-04-19 ENCOUNTER — Ambulatory Visit (INDEPENDENT_AMBULATORY_CARE_PROVIDER_SITE_OTHER): Payer: Medicaid Other | Admitting: Adult Health

## 2013-04-19 ENCOUNTER — Encounter: Payer: Self-pay | Admitting: Adult Health

## 2013-04-19 VITALS — BP 128/82 | Ht 65.0 in | Wt 167.4 lb

## 2013-04-19 DIAGNOSIS — Z3490 Encounter for supervision of normal pregnancy, unspecified, unspecified trimester: Secondary | ICD-10-CM

## 2013-04-19 DIAGNOSIS — N898 Other specified noninflammatory disorders of vagina: Secondary | ICD-10-CM

## 2013-04-19 DIAGNOSIS — N939 Abnormal uterine and vaginal bleeding, unspecified: Secondary | ICD-10-CM

## 2013-04-19 DIAGNOSIS — Z3201 Encounter for pregnancy test, result positive: Secondary | ICD-10-CM

## 2013-04-19 DIAGNOSIS — Z1389 Encounter for screening for other disorder: Secondary | ICD-10-CM

## 2013-04-19 DIAGNOSIS — Z3200 Encounter for pregnancy test, result unknown: Secondary | ICD-10-CM

## 2013-04-19 DIAGNOSIS — O4690 Antepartum hemorrhage, unspecified, unspecified trimester: Secondary | ICD-10-CM

## 2013-04-19 HISTORY — DX: Antepartum hemorrhage, unspecified, unspecified trimester: O46.90

## 2013-04-19 LAB — POCT URINALYSIS DIPSTICK
Blood, UA: 3
Glucose, UA: NEGATIVE
Ketones, UA: NEGATIVE

## 2013-04-19 LAB — POCT URINE PREGNANCY: Preg Test, Ur: POSITIVE

## 2013-04-19 NOTE — Patient Instructions (Addendum)
Follow up in 2 days for [facility] No sex

## 2013-04-19 NOTE — Progress Notes (Signed)
Subjective:     Patient ID: Megan Stevens, female   DOB: 15-Aug-1986, 27 y.o.   MRN: [id number]  HPI Megan Stevens is in today complaining of vaginal bleeding since 03/22/13. She stopped taking the lexapro, thinking it caused the bleeding.  Review of Systems See HPI Reviewed past medical,surgical, social and family history. Reviewed medications and allergies.     Objective:   Physical Exam BP 128/82  Ht 5\' 5"  (1.651 m)  Wt 167 lb 6.4 oz (75.932 kg)  BMI 27.86 kg/m2  LMP 03/15/2013   Urine dipstick 3+blood and 1+protein and urine pregnancy test was positive, it was negative 03/17/13. And the LMP was actually 03/22/13.Had sex July 4th. Skin warm and dry.Pelvic: external genitalia is normal in appearance, vagina:+bleeding without odor, cervix:smooth and bulbous, uterus: normal size, shape and contour, non tender, no masses felt, adnexa: no masses or tenderness noted. GC/CHL obtained.  Assessment:      Vaginal bleeding with +urine pregnancy test Recent delivery 12/24/12 of previable fetus History of post partum depresion    Plan:      Check QHCG now and in 2 days Take the lexapro.

## 2013-04-19 NOTE — Assessment & Plan Note (Signed)
Check QHCG 

## 2013-04-20 ENCOUNTER — Telehealth: Payer: Self-pay | Admitting: Adult Health

## 2013-04-20 LAB — GC/CHLAMYDIA PROBE AMP: GC Probe RNA: NEGATIVE

## 2013-04-20 LAB — HCG, QUANTITATIVE, PREGNANCY: hCG, Beta Chain, Quant, S: 61.4 m[IU]/mL

## 2013-04-20 NOTE — Telephone Encounter (Signed)
No answer

## 2013-04-21 ENCOUNTER — Other Ambulatory Visit: Payer: Medicaid Other

## 2013-04-21 ENCOUNTER — Encounter: Payer: Self-pay | Admitting: *Deleted

## 2013-04-21 DIAGNOSIS — Z3200 Encounter for pregnancy test, result unknown: Secondary | ICD-10-CM

## 2013-04-22 ENCOUNTER — Telehealth: Payer: Self-pay | Admitting: Obstetrics & Gynecology

## 2013-04-22 LAB — HCG, QUANTITATIVE, PREGNANCY: hCG, Beta Chain, Quant, S: 69.8 m[IU]/mL

## 2013-04-22 NOTE — Telephone Encounter (Signed)
Pt informed of Qhcg level of 69.8. Pt to come in on Monday, April 25, 2013 for repeat [facility].

## 2013-04-22 NOTE — Telephone Encounter (Signed)
Pt states continues to have vaginal bleeding since her appointment with Cyril Mourning, NP. Informed pt was repeating QHCG on Monday to see if the number doubles, to early in pregnancy to verify with ultrasound according to [facility] of 69.8. Pt verbalized understanding.

## 2013-04-25 ENCOUNTER — Telehealth: Payer: Self-pay | Admitting: Obstetrics and Gynecology

## 2013-04-25 ENCOUNTER — Other Ambulatory Visit: Payer: Medicaid Other

## 2013-04-25 DIAGNOSIS — O200 Threatened abortion: Secondary | ICD-10-CM

## 2013-04-25 NOTE — Telephone Encounter (Signed)
No answer @ 4:39pm. [name]

## 2013-04-25 NOTE — Telephone Encounter (Signed)
Pt informed QHCG tha was done today was still pending.

## 2013-04-26 ENCOUNTER — Telehealth: Payer: Self-pay | Admitting: *Deleted

## 2013-04-26 LAB — HCG, QUANTITATIVE, PREGNANCY: hCG, Beta Chain, Quant, S: 45.3 m[IU]/mL

## 2013-04-26 NOTE — Telephone Encounter (Signed)
Megan Stevens, please review labs. Megan Stevens has went down more. Thanks!!! [employer]

## 2013-04-26 NOTE — Telephone Encounter (Signed)
Pt aware QHCG his dropping and needs to be rechecked in 1 week

## 2013-05-03 ENCOUNTER — Other Ambulatory Visit: Payer: Medicaid Other

## 2013-05-04 ENCOUNTER — Other Ambulatory Visit: Payer: Medicaid Other

## 2013-05-04 DIAGNOSIS — O4690 Antepartum hemorrhage, unspecified, unspecified trimester: Secondary | ICD-10-CM

## 2013-05-04 LAB — HCG, QUANTITATIVE, PREGNANCY: hCG, Beta Chain, Quant, S: 28.7 m[IU]/mL

## 2013-05-05 ENCOUNTER — Telehealth: Payer: Self-pay | Admitting: Obstetrics and Gynecology

## 2013-05-05 NOTE — Telephone Encounter (Signed)
Pt informed QHCG continues to drop now at 28.7. Will discuss next step with Cyril Mourning, NP and contact pt.

## 2013-05-05 NOTE — Telephone Encounter (Signed)
Pt aware numbers dropping still will recheck [facility] in 1 week, and she said the bleeding stopped 2 days ago.

## 2013-05-12 ENCOUNTER — Other Ambulatory Visit: Payer: Medicaid Other

## 2013-05-19 ENCOUNTER — Other Ambulatory Visit: Payer: Medicaid Other

## 2013-05-19 DIAGNOSIS — O039 Complete or unspecified spontaneous abortion without complication: Secondary | ICD-10-CM

## 2013-05-20 LAB — HCG, QUANTITATIVE, PREGNANCY: hCG, Beta Chain, Quant, S: 4.9 m[IU]/mL

## 2013-05-23 ENCOUNTER — Telehealth: Payer: Self-pay | Admitting: Adult Health

## 2013-05-23 NOTE — Telephone Encounter (Signed)
Pt informed of QHCG 4.9 from 05/19/2013, no need for no more labs. Call transferred to front staff for an appt to be made to see provider for questionable herpes.

## 2013-05-27 ENCOUNTER — Ambulatory Visit: Payer: Medicaid Other | Admitting: Obstetrics & Gynecology

## 2013-06-27 ENCOUNTER — Encounter: Payer: Self-pay | Admitting: Adult Health

## 2013-06-27 ENCOUNTER — Ambulatory Visit (INDEPENDENT_AMBULATORY_CARE_PROVIDER_SITE_OTHER): Payer: Medicaid Other | Admitting: Adult Health

## 2013-06-27 VITALS — BP 120/76 | Ht 65.0 in | Wt 176.0 lb

## 2013-06-27 DIAGNOSIS — Z3200 Encounter for pregnancy test, result unknown: Secondary | ICD-10-CM

## 2013-06-27 DIAGNOSIS — Z3201 Encounter for pregnancy test, result positive: Secondary | ICD-10-CM

## 2013-06-28 ENCOUNTER — Telehealth: Payer: Self-pay | Admitting: Obstetrics and Gynecology

## 2013-06-28 ENCOUNTER — Telehealth: Payer: Self-pay | Admitting: Obstetrics & Gynecology

## 2013-06-28 LAB — PROGESTERONE: Progesterone: 17.5 ng/mL

## 2013-06-28 NOTE — Telephone Encounter (Signed)
Pt informed of QHCG results 10034.9  from 06/27/2013. Pt ask that we monitor this pregnancy closely due to HX of recurrent MAB.

## 2013-06-28 NOTE — Telephone Encounter (Signed)
Pt wanted to verify [facility]. Informed of QHCG 10034.9 confirmed U/S appt on 07/05/13.

## 2013-07-01 ENCOUNTER — Other Ambulatory Visit: Payer: Self-pay | Admitting: Obstetrics & Gynecology

## 2013-07-01 DIAGNOSIS — O3680X0 Pregnancy with inconclusive fetal viability, not applicable or unspecified: Secondary | ICD-10-CM

## 2013-07-04 ENCOUNTER — Telehealth: Payer: Self-pay | Admitting: *Deleted

## 2013-07-04 ENCOUNTER — Encounter: Payer: Medicaid Other | Admitting: Women's Health

## 2013-07-04 NOTE — Telephone Encounter (Signed)
Pt noticed blood in stool this am. Pt states no complaints of straining with stool or constipation. Call transferred to front staff for an appt.

## 2013-07-05 ENCOUNTER — Encounter: Payer: Self-pay | Admitting: Obstetrics & Gynecology

## 2013-07-05 ENCOUNTER — Ambulatory Visit (INDEPENDENT_AMBULATORY_CARE_PROVIDER_SITE_OTHER): Payer: Medicaid Other

## 2013-07-05 DIAGNOSIS — O2621 Pregnancy care for patient with recurrent pregnancy loss, first trimester: Secondary | ICD-10-CM

## 2013-07-05 DIAGNOSIS — O2620 Pregnancy care for patient with recurrent pregnancy loss, unspecified trimester: Secondary | ICD-10-CM

## 2013-07-05 DIAGNOSIS — O3680X0 Pregnancy with inconclusive fetal viability, not applicable or unspecified: Secondary | ICD-10-CM

## 2013-07-05 NOTE — Progress Notes (Signed)
U/S(5+4wks)-single IUP with +FCA noted, FHR-113 bpm, cx long and closed, bilateral adnexa wnl, CRL c/w ?LMP dates

## 2013-07-18 ENCOUNTER — Encounter: Payer: Self-pay | Admitting: *Deleted

## 2013-07-18 ENCOUNTER — Encounter: Payer: Medicaid Other | Admitting: Women's Health

## 2013-07-27 ENCOUNTER — Telehealth: Payer: Self-pay | Admitting: Obstetrics and Gynecology

## 2013-07-27 NOTE — Telephone Encounter (Signed)
Mailbox full @ 10:05am. [name]

## 2013-07-29 NOTE — Telephone Encounter (Signed)
Pt states she was seen in office 07/28/13 and found out how many weeks she was. [name]

## 2013-08-01 ENCOUNTER — Other Ambulatory Visit (HOSPITAL_COMMUNITY)
Admission: RE | Admit: 2013-08-01 | Discharge: 2013-08-01 | Disposition: A | Discharge status: Home / Self Care | Payer: Medicaid Other | Source: Ambulatory Visit | Attending: Adult Health | Admitting: Adult Health

## 2013-08-01 ENCOUNTER — Ambulatory Visit (INDEPENDENT_AMBULATORY_CARE_PROVIDER_SITE_OTHER): Payer: Medicaid Other | Admitting: Adult Health

## 2013-08-01 ENCOUNTER — Encounter: Payer: Self-pay | Admitting: Adult Health

## 2013-08-01 VITALS — BP 120/80 | Wt 180.0 lb

## 2013-08-01 DIAGNOSIS — O09299 Supervision of pregnancy with other poor reproductive or obstetric history, unspecified trimester: Secondary | ICD-10-CM

## 2013-08-01 DIAGNOSIS — Z98891 History of uterine scar from previous surgery: Secondary | ICD-10-CM

## 2013-08-01 DIAGNOSIS — O2620 Pregnancy care for patient with recurrent pregnancy loss, unspecified trimester: Secondary | ICD-10-CM

## 2013-08-01 DIAGNOSIS — Z113 Encounter for screening for infections with a predominantly sexual mode of transmission: Secondary | ICD-10-CM | POA: Insufficient documentation

## 2013-08-01 DIAGNOSIS — Z1389 Encounter for screening for other disorder: Secondary | ICD-10-CM

## 2013-08-01 DIAGNOSIS — Z8751 Personal history of pre-term labor: Secondary | ICD-10-CM

## 2013-08-01 DIAGNOSIS — O34219 Maternal care for unspecified type scar from previous cesarean delivery: Secondary | ICD-10-CM

## 2013-08-01 DIAGNOSIS — Z331 Pregnant state, incidental: Secondary | ICD-10-CM

## 2013-08-01 DIAGNOSIS — Z01419 Encounter for gynecological examination (general) (routine) without abnormal findings: Secondary | ICD-10-CM | POA: Insufficient documentation

## 2013-08-01 DIAGNOSIS — Z3490 Encounter for supervision of normal pregnancy, unspecified, unspecified trimester: Secondary | ICD-10-CM

## 2013-08-01 DIAGNOSIS — Z3481 Encounter for supervision of other normal pregnancy, first trimester: Secondary | ICD-10-CM

## 2013-08-01 DIAGNOSIS — O09219 Supervision of pregnancy with history of pre-term labor, unspecified trimester: Secondary | ICD-10-CM

## 2013-08-01 DIAGNOSIS — IMO0002 Reserved for concepts with insufficient information to code with codable children: Secondary | ICD-10-CM

## 2013-08-01 HISTORY — DX: Personal history of pre-term labor: Z87.51

## 2013-08-01 HISTORY — DX: History of uterine scar from previous surgery: Z98.891

## 2013-08-01 HISTORY — DX: Encounter for supervision of normal pregnancy, unspecified, unspecified trimester: Z34.90

## 2013-08-01 LAB — POCT URINALYSIS DIPSTICK
Glucose, UA: NEGATIVE
Ketones, UA: NEGATIVE
Leukocytes, UA: NEGATIVE
Nitrite, UA: NEGATIVE

## 2013-08-01 MED ORDER — ONDANSETRON 8 MG PO TBDP: 8.0000 mg | ORAL_TABLET | Freq: Three times a day (TID) | ORAL | Status: DC | PRN
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
## 2013-08-01 NOTE — Patient Instructions (Signed)

## 2013-08-01 NOTE — Progress Notes (Signed)
Pt here today for New OB visit. Pt given CCNC form and lab consents to read over and sign. Pt states that she has been having some cramping but no bleeding.

## 2013-08-02 LAB — URINE CULTURE: Colony Count: 6000

## 2013-08-02 LAB — DRUG SCREEN, URINE, NO CONFIRMATION
Amphetamine Screen, Ur: NEGATIVE
Benzodiazepines.: NEGATIVE
Cocaine Metabolites: NEGATIVE
Marijuana Metabolite: POSITIVE — AB

## 2013-08-02 LAB — HIV ANTIBODY (ROUTINE TESTING W REFLEX): HIV: NONREACTIVE

## 2013-08-02 LAB — CBC
HCT: 37.5 % (ref 36.0–46.0)
MCHC: 33.3 g/dL (ref 30.0–36.0)
Platelets: 314 10*3/uL (ref 150–400)
RDW: 14.4 % (ref 11.5–15.5)
WBC: 9.6 10*3/uL (ref 4.0–10.5)

## 2013-08-02 LAB — URINALYSIS
Bilirubin Urine: NEGATIVE
Glucose, UA: NEGATIVE mg/dL
Hgb urine dipstick: NEGATIVE
Protein, ur: NEGATIVE mg/dL
Urobilinogen, UA: 0.2 mg/dL (ref 0.0–1.0)

## 2013-08-02 LAB — RUBELLA SCREEN: Rubella: 8.15 Index — ABNORMAL HIGH (ref <0.90)

## 2013-08-02 LAB — ABO AND RH

## 2013-08-02 LAB — OXYCODONE SCREEN, UA, RFLX CONFIRM: Oxycodone Screen, Ur: NEGATIVE ng/mL

## 2013-08-02 LAB — TSH: TSH: 1.082 u[IU]/mL (ref 0.350–4.500)

## 2013-08-02 LAB — ANTIBODY SCREEN: Antibody Screen: NEGATIVE

## 2013-08-04 ENCOUNTER — Telehealth: Payer: Self-pay | Admitting: Adult Health

## 2013-08-04 NOTE — Telephone Encounter (Signed)
Mailbox full

## 2013-08-08 ENCOUNTER — Encounter: Payer: Self-pay | Admitting: Adult Health

## 2013-08-08 DIAGNOSIS — R87619 Unspecified abnormal cytological findings in specimens from cervix uteri: Secondary | ICD-10-CM

## 2013-08-08 DIAGNOSIS — IMO0002 Reserved for concepts with insufficient information to code with codable children: Secondary | ICD-10-CM

## 2013-08-08 HISTORY — DX: Unspecified abnormal cytological findings in specimens from cervix uteri: R87.619

## 2013-08-08 HISTORY — DX: Reserved for concepts with insufficient information to code with codable children: IMO0002

## 2013-08-11 ENCOUNTER — Other Ambulatory Visit: Payer: Self-pay

## 2013-08-22 ENCOUNTER — Ambulatory Visit (INDEPENDENT_AMBULATORY_CARE_PROVIDER_SITE_OTHER): Payer: Medicaid Other | Admitting: Obstetrics and Gynecology

## 2013-08-22 ENCOUNTER — Ambulatory Visit (INDEPENDENT_AMBULATORY_CARE_PROVIDER_SITE_OTHER): Payer: Medicaid Other

## 2013-08-22 ENCOUNTER — Other Ambulatory Visit: Payer: Self-pay | Admitting: Obstetrics and Gynecology

## 2013-08-22 VITALS — BP 142/76 | Wt 177.2 lb

## 2013-08-22 DIAGNOSIS — Z36 Encounter for antenatal screening of mother: Secondary | ICD-10-CM

## 2013-08-22 DIAGNOSIS — O2620 Pregnancy care for patient with recurrent pregnancy loss, unspecified trimester: Secondary | ICD-10-CM

## 2013-08-22 DIAGNOSIS — O09219 Supervision of pregnancy with history of pre-term labor, unspecified trimester: Secondary | ICD-10-CM

## 2013-08-22 DIAGNOSIS — O09299 Supervision of pregnancy with other poor reproductive or obstetric history, unspecified trimester: Secondary | ICD-10-CM

## 2013-08-22 DIAGNOSIS — O0991 Supervision of high risk pregnancy, unspecified, first trimester: Secondary | ICD-10-CM

## 2013-08-22 DIAGNOSIS — Z331 Pregnant state, incidental: Secondary | ICD-10-CM

## 2013-08-22 DIAGNOSIS — Z1389 Encounter for screening for other disorder: Secondary | ICD-10-CM

## 2013-08-22 DIAGNOSIS — O4400 Placenta previa specified as without hemorrhage, unspecified trimester: Secondary | ICD-10-CM

## 2013-08-22 DIAGNOSIS — O34219 Maternal care for unspecified type scar from previous cesarean delivery: Secondary | ICD-10-CM

## 2013-08-22 DIAGNOSIS — Z98891 History of uterine scar from previous surgery: Secondary | ICD-10-CM

## 2013-08-22 DIAGNOSIS — Z3481 Encounter for supervision of other normal pregnancy, first trimester: Secondary | ICD-10-CM

## 2013-08-22 LAB — POCT URINALYSIS DIPSTICK
Glucose, UA: NEGATIVE
Ketones, UA: NEGATIVE
Leukocytes, UA: NEGATIVE
Protein, UA: NEGATIVE

## 2013-08-22 MED ORDER — PRENATAL PLUS 27-1 MG PO TABS: 1.0000 | ORAL_TABLET | Freq: Every day | ORAL | Status: DC

## 2013-08-22 NOTE — Progress Notes (Signed)
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

## 2013-08-22 NOTE — Progress Notes (Signed)
U/S(12+3wks)-single IUP with +FCA noted, FHR-144 bpm, CRL c/w dates, cx long and closed(3.3cm), bilateral adnexa WNL, NB present, NT-1.108mm

## 2013-08-22 NOTE — Patient Instructions (Signed)
Colposcopy 1 wk Colposcopy Colposcopy is a procedure to examine your cervix and vagina, or the area around the outside of your vagina, for abnormalities or signs of disease. The procedure is done using a lighted microscope called a colposcope. Tissue samples may be collected during the colposcopy if your health care provider finds any unusual cells. A colposcopy may be done if a woman has:  An abnormal Pap test. A Pap test is a medical test done to evaluate cells that are on the surface of the cervix.  A Pap test result that is suggestive of human papillomavirus (HPV). This virus can cause genital warts and is linked to the development of cervical cancer.  A sore on her cervix and the results of a Pap test were normal.  Genital warts on the cervix or in or around the outside of the vagina.  A mother who took the drug diethylstilbestrol (DES) while pregnant.  Painful intercourse.  Vaginal bleeding, especially after sexual intercourse. LET [facility] CARE PROVIDER KNOW ABOUT:  Any allergies you have.  All medicines you are taking, including vitamins, herbs, eye drops, creams, and over-the-counter medicines.  Previous problems you or members of your family have had with the use of anesthetics.  Any blood disorders you have.  Previous surgeries you have had.  Medical conditions you have. RISKS AND COMPLICATIONS Generally, a colposcopy is a safe procedure. However, as with any procedure, complications can occur. Possible complications include:  Bleeding.  Infection.  Missed lesions. BEFORE THE PROCEDURE   Tell your health care provider if you have your menstrual period. A colposcopy typically is not done during menstruation.  For 24 hours before the colposcopy, do not:  Douche.  Use tampons.  Use medicines, creams, or suppositories in the vagina.  Have sexual intercourse. PROCEDURE  During the procedure, you will be lying on your back with your feet in foot rests  (stirrups). A warm metal or plastic instrument (speculum) will be placed in your vagina to keep it open and to allow the health care provider to see the cervix. The colposcope will be placed outside the vagina. It will be used to magnify and examine the cervix, vagina, and the area around the outside of the vagina. A small amount of liquid solution will be placed on the area that is to be viewed. This solution will make it easier to see the abnormal cells. Your health care provider will use tools to suck out mucus and cells from the canal of the cervix. Then he or she will record the location of the abnormal areas. If a biopsy is done during the procedure, a medicine will usually be given to numb the area (local anesthetic). You may feel mild pain or cramping while the biopsy is done. After the procedure, tissue samples collected during the biopsy will be sent to a lab for analysis. AFTER THE PROCEDURE  You will be given instructions on when to follow up with your health care provider for your test results. It is important to keep your appointment. Document Released: 12/13/2002 Document Revised: 05/25/2013 Document Reviewed: 04/21/2013 [facility] Patient Information 2014 [place], [place].

## 2013-08-25 LAB — MATERNAL SCREEN, INTEGRATED #1

## 2013-08-30 ENCOUNTER — Encounter: Payer: Medicaid Other | Admitting: Obstetrics and Gynecology

## 2013-08-30 ENCOUNTER — Other Ambulatory Visit: Payer: Self-pay | Admitting: Adult Health

## 2013-08-30 ENCOUNTER — Telehealth: Payer: Self-pay | Admitting: *Deleted

## 2013-08-30 ENCOUNTER — Ambulatory Visit (INDEPENDENT_AMBULATORY_CARE_PROVIDER_SITE_OTHER): Payer: Medicaid Other | Admitting: Adult Health

## 2013-08-30 ENCOUNTER — Other Ambulatory Visit (INDEPENDENT_AMBULATORY_CARE_PROVIDER_SITE_OTHER): Payer: Medicaid Other

## 2013-08-30 ENCOUNTER — Encounter: Payer: Self-pay | Admitting: Adult Health

## 2013-08-30 VITALS — BP 104/68 | Wt 175.8 lb

## 2013-08-30 DIAGNOSIS — O43192 Other malformation of placenta, second trimester: Secondary | ICD-10-CM

## 2013-08-30 DIAGNOSIS — Z1389 Encounter for screening for other disorder: Secondary | ICD-10-CM

## 2013-08-30 DIAGNOSIS — O200 Threatened abortion: Secondary | ICD-10-CM

## 2013-08-30 DIAGNOSIS — O4420 Partial placenta previa NOS or without hemorrhage, unspecified trimester: Secondary | ICD-10-CM | POA: Insufficient documentation

## 2013-08-30 DIAGNOSIS — O4411 Placenta previa with hemorrhage, first trimester: Secondary | ICD-10-CM

## 2013-08-30 DIAGNOSIS — O43899 Other placental disorders, unspecified trimester: Secondary | ICD-10-CM

## 2013-08-30 DIAGNOSIS — Z331 Pregnant state, incidental: Secondary | ICD-10-CM

## 2013-08-30 DIAGNOSIS — O4400 Placenta previa specified as without hemorrhage, unspecified trimester: Secondary | ICD-10-CM

## 2013-08-30 DIAGNOSIS — O209 Hemorrhage in early pregnancy, unspecified: Secondary | ICD-10-CM

## 2013-08-30 LAB — POCT URINALYSIS DIPSTICK
Blood, UA: NEGATIVE
Glucose, UA: NEGATIVE
Leukocytes, UA: NEGATIVE
Nitrite, UA: NEGATIVE
Protein, UA: NEGATIVE

## 2013-08-30 NOTE — Patient Instructions (Signed)
Placenta Previa  Placenta previa is a condition in pregnant women where the placenta implants in the lower part of the uterus. The placenta either partially or completely covers the opening to the cervix. This is a problem because the baby must pass through the cervix during delivery. There are three types of placenta previa. They include:  1. Marginal placenta previa. The placenta is near the cervix, but does not cover the opening. 2. Partial placenta previa. The placenta covers part of the cervical opening. 3. Complete placenta previa. The placenta covers the entire cervical opening.  Depending on the type of placenta previa, there is a chance the placenta may move into a normal position and no longer cover the cervix as the pregnancy progresses. It is important to keep all prenatal visits with your caregiver.  RISK FACTORS You may be more likely to develop placenta previa if you:   Are carrying more than one baby (multiples).   Have an abnormally shaped uterus.   Have scars on the lining of the uterus.   Had previous surgeries involving the uterus, such as a cesarean delivery.   Have delivered a baby previously.   Have a history of placenta previa.   Have smoked or used cocaine during pregnancy.   Are age 14 or older during pregnancy.  SYMPTOMS The main symptom of placenta previa is sudden, painless vaginal bleeding during the second half of pregnancy. The amount of bleeding can be light to very heavy. The bleeding may stop on its own, but almost always returns. Cramping, regular contractions, abdominal pain, and lower back pain can also occur with placenta previa.  DIAGNOSIS Placenta previa can be diagnosed through an ultrasound by finding where the placenta is located. The ultrasound may find placenta previa either during a routine prenatal visit or after vaginal bleeding is noticed. If you are diagnosed with placenta previa, your caregiver may avoid vaginal exams to reduce  the risk of heavy bleeding. There is a chance that placenta previa may not be diagnosed until bleeding occurs during labor.  TREATMENT Specific treatment depends on:   How much you are bleeding or if the bleeding has stopped.  How far along you are in your pregnancy.   The condition of the baby.   The location of the baby and placenta.   The type of placenta previa.  Depending on the factors above, your caregiver may recommend:   Decreased activity.   Bed rest at home or in the hospital.  Pelvic rest. This means no sex, using tampons, douching, pelvic exams, or placing anything into the vagina.  A blood transfusion to replace maternal blood loss.  A cesarean delivery if the bleeding is heavy and cannot be controlled or the placenta completely covers the cervix.  Medication to stop premature labor or mature the fetal lungs if delivery is needed before the pregnancy is full term.  WHEN SHOULD YOU SEEK IMMEDIATE MEDICAL CARE IF YOU ARE SENT HOME WITH PLACENTA PREVIA? Seek immediate medical care if you show any symptoms of placenta previa. You will need to go to the hospital to get checked immediately. Again, those symptoms are:  Sudden, painless vaginal bleeding, even a small amount.  Cramping or regular contractions.  Lower back or abdominal pain. Document Released: 09/22/2005 Document Revised: 05/25/2013 Document Reviewed: 12/24/2012 [facility] Patient Information 2014 [place], [place]. NO sex Follow up in 1 week for colpo with Dr Emelda Fear Abnormal Pap Test Information During a Pap test, the cells on the surface of your  cervix are checked to see if they look normal, abnormal, or if they show signs of having been altered by a certain type of virus called human papillomavirus, or HPV. Cervical cells that have been affected by HPV are called dysplasia. Dysplasia is not cancer, but describes abnormal cells found on the surface of the cervix. Depending on the degree of dysplasia,  some of the cells may be considered pre-cancerous and may turn into cancer over time if follow up with a caregiver is delayed.  WHAT DOES AN ABNORMAL PAP TEST MEAN? Having an abnormal pap test does not mean that you have cancer. However, certain types of abnormal pap tests can be a sign that a person is at a higher risk of developing cancer. Your caregiver will want to do other tests to find out more about the abnormal cells. Your abnormal Pap test results could show:   Small and uncertain changes that should be carefully watched.   Cervical dysplasia that has caused mild changes and can be followed over time.  Cervical dysplasia that is more severe and needs to be followed and treated to ensure the problem goes away.  Cancer.  When severe cervical dysplasia is found and treated early, it rarely will grow into cancer.  WHAT WILL BE DONE ABOUT MY ABNORMAL PAP TEST?  A colposcopy may be needed. This is a procedure where your cervix is examined using light and magnification.  A small tissue sample of your cervix (biopsy) may need to be removed and then examined. This is often performed if there are areas that appear infected.  A sample of cells from the cervical canal may be removed with either a small brush or scraping instrument (curette). Based on the results of the procedures above, some caregivers may recommend either cryotherapy of the cervix or a surgical LEEP where a portion of the cervix is removed. LEEP is short for "loop electrical excisional procedure." Rarely, a caregiver may recommend a cone biopsy.This is a procedure where a small, cone-shaped sample of your cervix is taken out. The part that is taken out is the area where the abnormal cells are.  WHAT IF I HAVE A DYSPLASIA OR A CANCER? You may be referred to a specialist. Radiation may also be a treatment for more advanced cancer. Having a hysterectomy is the last treatment option for dysplasia, but it is a more common  treatment for someone with cancer. All treatment options will be discussed with you by your caregiver. WHAT SHOULD YOU DO AFTER BEING TREATED? If you have had an abnormal pap test, you should continue to have regular pap tests and check-ups as directed by your caregiver. Your cervical problem will be carefully watched so it does not get worse. Also, your caregiver can watch for, and treat, any new problems that may come up. Document Released: 01/07/2011 Document Revised: 01/17/2013 Document Reviewed: 09/18/2011 [facility] Patient Information 2014 [place], [place].

## 2013-08-30 NOTE — Progress Notes (Signed)
Had bleeding and cramping yesterday, last sex last week, no bleeding today will get [place], US showed closed cervix 3.3 cm in length but has marginal posterior previa.Advised NO sex and follow up in 1 week for colpo with Dr Emelda Fear.On exam no visible blood or discharge.Needs to discuss cerclage with JVF then.

## 2013-08-30 NOTE — Telephone Encounter (Signed)
Spoke with pt. + cramping yesterday. Wiped blood yesterday, red. No bleeding today so far. Cramping not as bad right now. + urinary frequency. Has a history of miscarriages. Call transferred to front desk to schedule an appt for today. Pt voiced understanding. [name]

## 2013-08-30 NOTE — Progress Notes (Signed)
U/S(13+4wks)-vaginal u/s performed, pt c/o vaginal bleeding and pelvic cramping, active fetus , FHR- 141 bpm, retroverted uterus, cx-3.3cm closed, appears to be succ. Lobe of the placenta at posterior internal os, anterior placenta Gr 0, no obvious signs of hemorrhage noted

## 2013-09-07 ENCOUNTER — Encounter: Payer: Medicaid Other | Admitting: Obstetrics and Gynecology

## 2013-09-20 ENCOUNTER — Telehealth: Payer: Self-pay | Admitting: *Deleted

## 2013-09-20 NOTE — Telephone Encounter (Signed)
Pt what is her do date and how far along is she? Pt informed due date 03/03/13, 16 weeks and 4 days.

## 2013-09-21 ENCOUNTER — Encounter: Payer: Medicaid Other | Admitting: Obstetrics and Gynecology

## 2013-09-23 ENCOUNTER — Ambulatory Visit (INDEPENDENT_AMBULATORY_CARE_PROVIDER_SITE_OTHER): Payer: Medicaid Other | Admitting: Obstetrics and Gynecology

## 2013-09-23 ENCOUNTER — Telehealth: Payer: Self-pay | Admitting: *Deleted

## 2013-09-23 ENCOUNTER — Other Ambulatory Visit: Payer: Self-pay | Admitting: Obstetrics and Gynecology

## 2013-09-23 ENCOUNTER — Encounter: Payer: Self-pay | Admitting: Obstetrics and Gynecology

## 2013-09-23 VITALS — BP 122/60 | Wt 181.0 lb

## 2013-09-23 DIAGNOSIS — Z331 Pregnant state, incidental: Secondary | ICD-10-CM

## 2013-09-23 DIAGNOSIS — IMO0002 Reserved for concepts with insufficient information to code with codable children: Secondary | ICD-10-CM

## 2013-09-23 DIAGNOSIS — N871 Moderate cervical dysplasia: Secondary | ICD-10-CM

## 2013-09-23 DIAGNOSIS — Z1389 Encounter for screening for other disorder: Secondary | ICD-10-CM

## 2013-09-23 DIAGNOSIS — O09891 Supervision of other high risk pregnancies, first trimester: Secondary | ICD-10-CM

## 2013-09-23 LAB — POCT URINALYSIS DIPSTICK
Blood, UA: NEGATIVE
Glucose, UA: NEGATIVE
Leukocytes, UA: NEGATIVE
Nitrite, UA: NEGATIVE
Protein, UA: NEGATIVE

## 2013-09-23 NOTE — Progress Notes (Signed)
Colposcopy: Done:suspect cin 2-3 of post lip. Path Pending_____ cervix quite soft.but int os closed, ext os open 2 cm Record review: Pt HAS MISSED SEVERAL CRITICAL APPTS, INCLUDING VISIT DEC 3 TO DISCUSS/SCHEDULE CERCLAGE,  U/S APPT THIS WEEK DUE TO LOW LYING PLACENTA.  And missed appt to decide on 17 P treatement. Importance of patient taking responsibility for keeping appts emphasized to pt.  Plan: u/s anatomy ASAP, with cervical length assessment. Will need to decide on cerclage(i believe it may be indicated) and/or 17-p

## 2013-09-26 ENCOUNTER — Other Ambulatory Visit (INDEPENDENT_AMBULATORY_CARE_PROVIDER_SITE_OTHER): Payer: Medicaid Other

## 2013-09-26 ENCOUNTER — Other Ambulatory Visit: Payer: Self-pay | Admitting: Obstetrics and Gynecology

## 2013-09-26 DIAGNOSIS — O09892 Supervision of other high risk pregnancies, second trimester: Secondary | ICD-10-CM

## 2013-09-26 DIAGNOSIS — O26879 Cervical shortening, unspecified trimester: Secondary | ICD-10-CM

## 2013-09-26 DIAGNOSIS — O26872 Cervical shortening, second trimester: Secondary | ICD-10-CM

## 2013-09-26 DIAGNOSIS — O09212 Supervision of pregnancy with history of pre-term labor, second trimester: Secondary | ICD-10-CM

## 2013-09-26 DIAGNOSIS — O09219 Supervision of pregnancy with history of pre-term labor, unspecified trimester: Secondary | ICD-10-CM

## 2013-09-26 NOTE — Telephone Encounter (Signed)
Pt o call and schedule [place]

## 2013-09-26 NOTE — Progress Notes (Signed)
U/S(17+3wks)-transabdominal and transvaginal u/s performed, cx measured vaginally- 1.9cm, breech active fetus, FHR- 157 bpm, female fetus, bilateral ovaries/adnexa appears wnl, anterior Gr 0 placenta noted

## 2013-09-27 ENCOUNTER — Other Ambulatory Visit: Payer: Self-pay | Admitting: Obstetrics and Gynecology

## 2013-09-27 ENCOUNTER — Encounter (HOSPITAL_COMMUNITY): Payer: Self-pay | Admitting: Pharmacy Technician

## 2013-09-27 ENCOUNTER — Telehealth: Payer: Self-pay

## 2013-09-27 ENCOUNTER — Encounter (HOSPITAL_COMMUNITY): Payer: Self-pay

## 2013-09-27 ENCOUNTER — Ambulatory Visit (INDEPENDENT_AMBULATORY_CARE_PROVIDER_SITE_OTHER): Payer: Medicaid Other | Admitting: Advanced Practice Midwife

## 2013-09-27 ENCOUNTER — Encounter (HOSPITAL_COMMUNITY)
Admission: RE | Admit: 2013-09-27 | Discharge: 2013-09-27 | Disposition: A | Discharge status: Home / Self Care | Payer: Medicaid Other | Source: Ambulatory Visit | Attending: Obstetrics and Gynecology | Admitting: Obstetrics and Gynecology

## 2013-09-27 ENCOUNTER — Encounter: Payer: Self-pay | Admitting: Advanced Practice Midwife

## 2013-09-27 VITALS — BP 118/72 | Wt 181.0 lb

## 2013-09-27 DIAGNOSIS — O2620 Pregnancy care for patient with recurrent pregnancy loss, unspecified trimester: Secondary | ICD-10-CM

## 2013-09-27 DIAGNOSIS — Z01812 Encounter for preprocedural laboratory examination: Secondary | ICD-10-CM | POA: Insufficient documentation

## 2013-09-27 DIAGNOSIS — O09219 Supervision of pregnancy with history of pre-term labor, unspecified trimester: Secondary | ICD-10-CM

## 2013-09-27 DIAGNOSIS — O34219 Maternal care for unspecified type scar from previous cesarean delivery: Secondary | ICD-10-CM

## 2013-09-27 DIAGNOSIS — O09299 Supervision of pregnancy with other poor reproductive or obstetric history, unspecified trimester: Secondary | ICD-10-CM

## 2013-09-27 DIAGNOSIS — O4400 Placenta previa specified as without hemorrhage, unspecified trimester: Secondary | ICD-10-CM

## 2013-09-27 DIAGNOSIS — Z658 Other specified problems related to psychosocial circumstances: Secondary | ICD-10-CM | POA: Insufficient documentation

## 2013-09-27 DIAGNOSIS — Z8751 Personal history of pre-term labor: Secondary | ICD-10-CM

## 2013-09-27 DIAGNOSIS — O0991 Supervision of high risk pregnancy, unspecified, first trimester: Secondary | ICD-10-CM

## 2013-09-27 DIAGNOSIS — Z1389 Encounter for screening for other disorder: Secondary | ICD-10-CM

## 2013-09-27 DIAGNOSIS — Z331 Pregnant state, incidental: Secondary | ICD-10-CM

## 2013-09-27 LAB — POCT URINALYSIS DIPSTICK
Blood, UA: NEGATIVE
Glucose, UA: NEGATIVE
Ketones, UA: NEGATIVE
Leukocytes, UA: NEGATIVE
Protein, UA: NEGATIVE

## 2013-09-27 LAB — CBC
MCHC: 33.8 g/dL (ref 30.0–36.0)
MCV: 79.4 fL (ref 78.0–100.0)
Platelets: 290 10*3/uL (ref 150–400)
RBC: 4.85 MIL/uL (ref 3.87–5.11)
RDW: 13.3 % (ref 11.5–15.5)
WBC: 10.1 10*3/uL (ref 4.0–10.5)

## 2013-09-27 LAB — URINALYSIS, ROUTINE W REFLEX MICROSCOPIC
Bilirubin Urine: NEGATIVE
Leukocytes, UA: NEGATIVE
Nitrite: NEGATIVE
Protein, ur: NEGATIVE mg/dL
Specific Gravity, Urine: 1.02 (ref 1.005–1.030)
pH: 7 (ref 5.0–8.0)

## 2013-09-27 NOTE — H&P (Signed)
Megan Stevens is a 27 y.o. female presenting AT 18 wks with a history of cervical incompetence, scheduled for McDonald cerclage.  She has had multiple miscarriages in the past, most recently had an 19wk loss at [facility] after painless cervical dilation without preliminary sypmptoms. The patient was counselled over treatment options,a nd she agrees with recommended Cerclage.[name] History OB History   Grav Para Term Preterm Abortions TAB SAB Ect Mult Living   12 1  1 10  10   1      Past Medical History  Diagnosis Date  . Abnormal pap   . Miscarriage 12/24/2012  . Postpartum depression   . Abnormal Pap smear   . Vaginal bleeding in pregnancy 04/19/2013  . History of preterm labor 08/01/2013  . Pregnant 08/01/2013  . History of cesarean section 08/01/2013  . Abnormal Pap smear 08/08/2013    Pap CIN2/CIN3/CIS(HSIL)  Needs colpo   Past Surgical History  Procedure Laterality Date  . Cesarean section    . Dilation and curettage of uterus  2011  . Finger fracture surgery Right     pinky as child   Family History: family history includes Diabetes in her maternal grandmother and maternal uncle; Hypertension in her mother; Thyroid disease in her mother. Social History:  reports that she has been smoking Cigarettes.  She has a 5 pack-year smoking history. She has never used smokeless tobacco. She reports that she does not drink alcohol or use illicit drugs.   Prenatal Transfer Tool  Maternal Diabetes: No Genetic Screening: Normal Maternal Ultrasounds/Referrals: Abnormal:  Findings:   Other: Fetal Ultrasounds or other Referrals:  None short cervix 1.9 cm. lengtth  Maternal Substance Abuse:  No Significant Maternal Medications:  None Significant Maternal Lab Results:  None Other Comments:  pprior Classical cesarean, will need cesarean at 36 wk  Review of Systems  Constitutional: Negative.   Skin: Negative.       Last menstrual period 03/15/2013. Exam Physical Exam  Prenatal  labs: ABO, Rh: B/POS/-- (10/27 1145) Antibody: NEG (10/27 1145) Rubella: 8.15 (10/27 1145) RPR: NON REAC (10/27 1145)  HBsAg: NEGATIVE (10/27 1145)  HIV: NON REACTIVE (10/27 1145)  GBS:     Assessment/Plan:  Pregnancy 18wks Cervical incompetence Hx preterm birth PPlan: McDonald Cerclage.   Megan Stevens V 09/27/2013, 3:14 PM

## 2013-09-27 NOTE — Telephone Encounter (Signed)
Spoke with pt. Pt states she passed a mucusy, brown clot last pm. Not having any pain. Call transferred to front desk to schedule an appt for this afternoon. Pt voiced understanding. [name]

## 2013-09-27 NOTE — Progress Notes (Signed)
W/I to evauate "brown snotty discharge".  Had bx 12/19, TVUS yesterday. Previa resolved.  SSE:  Normal appearing d/c without mucous or blood.  CX:  Inter os closed, external os 1-2 cms/short and soft. 17p ordered today:  Will start Monday.  Pt lives ~ 1 mile from office, has been walking to appts.  Recommeded get [facility] for transportation d/t PTL risk.  Has appt for cerclage on TUesday.  FU here Monday for 17p and repeat TVUS per JVF.

## 2013-09-27 NOTE — H&P (Signed)
Megan Stevens is an 27 y.o. female. She is now [redacted] weeks pregnant , and is scheduled for placement of a McDonald cerclage due  history of cervical incompetence. She has had recurrent preterm deliveries, with a preterm delivery by cesarean section 24 weeks.  She had a classical incision on uterus, and will require early repeat cesarean section At this time she requires McDonald cerclage  Ultrasound has been performed in the office, and cervix length is 1.9-2.7 cm on repeat evaluations. Risks of the procedure including  rupture membranes, during the procedure, uterine irritability or bleeding have been discussed with patient who desires to proceed with recommended cerclage at this time. Pertinent Gynecological History: Menses: Pregnant and 17 week Bleeding:  Contraception: Undecided DES exposure: unknown Blood transfusions: none Sexually transmitted diseases: no past history Previous GYN Procedures: Classical cesarean section  Last mammogram:  Date:  Last pap: normal Date: 2013 OB History: G 12, P 0, 1, 10, 1   Menstrual History: Menarche age: 50 Patient's last menstrual period was 03/15/2013.    Past Medical History  Diagnosis Date  . Abnormal pap   . Miscarriage 12/24/2012  . Postpartum depression   . Abnormal Pap smear   . Vaginal bleeding in pregnancy 04/19/2013  . History of preterm labor 08/01/2013  . Pregnant 08/01/2013  . History of cesarean section 08/01/2013  . Abnormal Pap smear 08/08/2013    Pap CIN2/CIN3/CIS(HSIL)  Needs colpo    Past Surgical History  Procedure Laterality Date  . Cesarean section    . Dilation and curettage of uterus  2011    Family History  Problem Relation Age of Onset  . Diabetes Maternal Grandmother   . Hypertension Mother   . Thyroid disease Mother   . Diabetes Maternal Uncle     Social History:  reports that she quit smoking about a year ago. Her smoking use included Cigarettes. She smoked 1.00 pack per day. She has never used  smokeless tobacco. She reports that she does not drink alcohol or use illicit drugs.  Allergies: No Known Allergies  No prescriptions prior to admission    ROS  Last menstrual period 03/15/2013. Physical Exam  Constitutional: She is oriented to person, place, and time. She appears well-developed and well-nourished.  HENT:  Head: Normocephalic and atraumatic.  Eyes: Pupils are equal, round, and reactive to light.  Neck: Neck supple.  GI:  Gravid uterus consistent with dates prior Pfannenstiel lower abdominal incision  Genitourinary: Vagina normal.  Ultrasound performed 09/26/2013 showing a cervix that is 1.9 to 2.7 cm in length on repeated ultrasound evaluation  Musculoskeletal: Normal range of motion.  Neurological: She is alert and oriented to person, place, and time. She has normal reflexes.  Psychiatric: She has a normal mood and affect. Her behavior is normal.    No results found for this or any previous visit (from the past 24 hour(s)).  US Ob Transvaginal  09/26/2013   FOLLOW UP SONOGRAM   Megan Stevens is in the office for a follow up sonogram for cx length.  She is a 27 y.o. year old [id number] with Estimated Date of Delivery:  03/03/14 by LMP now at  [redacted]w[redacted]d weeks gestation. Thus far the pregnancy has  been complicated by habitual aborter, H/O PTD, and H/O stillborn .  GESTATION: SINGLETON  PRESENTATION: breech   FETAL ACTIVITY:          Heart rate         157 bpm  The fetus is active.  AMNIOTIC FLUID: The amniotic fluid volume is  normal,   PLACENTA LOCALIZATION:  anterior GRADE 0  CERVIX: Measures 1.9 cm(measured vaginally)  ADNEXA: The ovaries are normal.   GESTATIONAL AGE AND  BIOMETRICS:  Gestational criteria: Estimated Date of Delivery: 03/03/14 by LMP now at  [redacted]w[redacted]d   GENITALIA yes normal female        SUSPECTED ABNORMALITIES:  unable to perform anatomy screen at this time(limited for cx length)  QUALITY OF SCAN: satisfactory  TECHNICIAN COMMENTS:   U/S(17+3wks)-transabdominal and transvaginal u/s performed, cx measured  vaginally- 1.9cm, breech active fetus, FHR- 157 bpm, female fetus, bilateral  ovaries/adnexa appears wnl, anterior Gr 0 placenta noted   A copy of this report including all images has been saved and backed up to  a second source for retrieval if needed. All measures and details of the  anatomical scan, placentation, fluid volume and pelvic anatomy are  contained in that report.  Megan Stevens 09/26/2013 3:41 PM  Clinical Impression and recommendations:  I have reviewed the sonogram results above.  Combined with the patient's current clinical course, below are my  impressions and any appropriate recommendations for management based on  the sonographic findings:  Shortened cervix noted , and we will schedule McDonald Cerclage as soon as  possible . Pt to be routed for scheduling of cerclage to Megan Stevens today.  Megan Stevens V      Assessment/Plan: History of cervical incompetence Pregnancy [redacted] weeks gestation History of prior classical cesarean section Blood type B+ Plan: McDonald cerclage 10/04/2013  Megan Stevens V 09/27/2013, 10:37 AM

## 2013-09-27 NOTE — Patient Instructions (Addendum)
Megan Stevens  09/27/2013   Your procedure is scheduled on:   10/04/2013  Report to [facility] at  615  AM.  Call this number if you have problems the morning of surgery: [phone]   Remember:   Do not eat food or drink liquids after midnight.   Take these medicines the morning of surgery with A SIP OF WATER: zofran   Do not wear jewelry, make-up or nail polish.  Do not wear lotions, powders, or perfumes.   Do not shave 48 hours prior to surgery. Men may shave face and neck.  Do not bring valuables to the hospital.  [facility] is not responsible for any belongings or valuables.               Contacts, dentures or bridgework may not be worn into surgery.  Leave suitcase in the car. After surgery it may be brought to your room.  For patients admitted to the hospital, discharge time is determined by your treatment team.               Patients discharged the day of surgery will not be allowed to drive home.  Name and phone number of your driver: family  Special Instructions: Shower using CHG 2 nights before surgery and the night before surgery.  If you shower the day of surgery use CHG.  Use special wash - you have one bottle of CHG for all showers.  You should use approximately 1/3 of the bottle for each shower.   Please read over the following fact sheets that you were given: Pain Booklet, Coughing and Deep Breathing, Surgical Site Infection Prevention, Anesthesia Post-op Instructions and Care and Recovery After Surgery Cerclage Cerclage of the cervix is a surgical procedure for an incompetent cervix. An incompetent cervix is a weak cervix that opens up before labor begins. Cerclage of the cervix sews the cervix closed during pregnancy.  LET [facility] CARE PROVIDER KNOW ABOUT:   Any allergies you have.  All medicines you are taking, including vitamins, herbs, eye drops, creams, and over-the-counter medicines.  Previous problems you or members of your family have had  with the use of anesthetics.  Any blood disorders you have.  Previous surgeries you have had.  Medical conditions you have.  Any recent colds or infections. RISKS AND COMPLICATIONS  Generally, this is a safe procedure. However, as with any procedure, complications can occur. Possible complications include:  Infection.  Bleeding.  Rupturing the amniotic sac (membranes).  Going into early labor and delivery.  Problems with the anesthesia.  Infection of the amniotic sac. BEFORE THE PROCEDURE   Ask your health care provider about changing or stopping your medicines.  Do not eat or drink anything for 6 8 hours before the procedure.  Arrange for someone to drive you home after the procedure. PROCEDURE   An IV tube will be placed in your vein. You will be given a sedative to help you relax. You will be given a medicine that makes you sleep through the procedure (general anesthetic) or a medicine injected into your spine that numbs your body below the waist (spinal or epidural anesthetic). You will be asleep or be numbed through the entire procedure.  A speculum will be placed in vagina to visualize your cervix.  The cervix is then grasped and stitched closed tightly.  Ultrasound may be used to guide the procedure and monitor the baby. AFTER THE PROCEDURE   You will  go to a recovery room where you and your unborn baby are monitored. Once you are awake, stable, and taking fluids well, you will be allowed to return to your room.  You will usually stay in the hospital overnight.  You may get an injection of progesterone to prevent uterine contractions.  You may be given pain-relieving medicines to take with you when you go home.  Have someone drive you home and stay with you for up to 2 days. Document Released: 09/04/2008 Document Revised: 05/25/2013 Document Reviewed: 04/13/2013 [facility] Patient Information 2014 [place], [place]. PATIENT  INSTRUCTIONS POST-ANESTHESIA  IMMEDIATELY FOLLOWING SURGERY:  Do not drive or operate machinery for the first twenty four hours after surgery.  Do not make any important decisions for twenty four hours after surgery or while taking narcotic pain medications or sedatives.  If you develop intractable nausea and vomiting or a severe headache please notify your doctor immediately.  FOLLOW-UP:  Please make an appointment with your surgeon as instructed. You do not need to follow up with anesthesia unless specifically instructed to do so.  WOUND CARE INSTRUCTIONS (if applicable):  Keep a dry clean dressing on the anesthesia/puncture wound site if there is drainage.  Once the wound has quit draining you may leave it open to air.  Generally you should leave the bandage intact for twenty four hours unless there is drainage.  If the epidural site drains for more than 36-48 hours please call the anesthesia department.  QUESTIONS?:  Please feel free to call your physician or the hospital operator if you have any questions, and they will be happy to assist you.

## 2013-10-03 ENCOUNTER — Ambulatory Visit (INDEPENDENT_AMBULATORY_CARE_PROVIDER_SITE_OTHER): Payer: Medicaid Other | Admitting: Obstetrics and Gynecology

## 2013-10-03 VITALS — BP 100/60 | Wt 182.4 lb

## 2013-10-03 DIAGNOSIS — Z1389 Encounter for screening for other disorder: Secondary | ICD-10-CM

## 2013-10-03 DIAGNOSIS — O4400 Placenta previa specified as without hemorrhage, unspecified trimester: Secondary | ICD-10-CM

## 2013-10-03 DIAGNOSIS — O2620 Pregnancy care for patient with recurrent pregnancy loss, unspecified trimester: Secondary | ICD-10-CM

## 2013-10-03 DIAGNOSIS — Z8751 Personal history of pre-term labor: Secondary | ICD-10-CM

## 2013-10-03 DIAGNOSIS — O34219 Maternal care for unspecified type scar from previous cesarean delivery: Secondary | ICD-10-CM

## 2013-10-03 DIAGNOSIS — O09219 Supervision of pregnancy with history of pre-term labor, unspecified trimester: Secondary | ICD-10-CM

## 2013-10-03 DIAGNOSIS — O99891 Other specified diseases and conditions complicating pregnancy: Secondary | ICD-10-CM

## 2013-10-03 DIAGNOSIS — Z331 Pregnant state, incidental: Secondary | ICD-10-CM

## 2013-10-03 DIAGNOSIS — O09299 Supervision of pregnancy with other poor reproductive or obstetric history, unspecified trimester: Secondary | ICD-10-CM

## 2013-10-03 LAB — POCT URINALYSIS DIPSTICK
Glucose, UA: NEGATIVE
Ketones, UA: NEGATIVE
Nitrite, UA: NEGATIVE

## 2013-10-03 MED ORDER — HYDROXYPROGESTERONE CAPROATE 250 MG/ML IM OIL
250.0000 mg | TOPICAL_OIL | Freq: Once | INTRAMUSCULAR | Status: AC
  Administered 2013-10-03: 250 mg via INTRAMUSCULAR

## 2013-10-03 MED ORDER — PRENATAL PLUS 27-1 MG PO TABS: 1.0000 | ORAL_TABLET | Freq: Every day | ORAL | Status: DC

## 2013-10-03 NOTE — Addendum Note (Signed)
Addended by: Criss Alvine on: 10/03/2013 11:40 AM   Modules accepted: Orders

## 2013-10-03 NOTE — Progress Notes (Signed)
Cervix, long posterior visual check. Surgery in a.m.

## 2013-10-03 NOTE — Patient Instructions (Signed)
Cerclage in a.m. Cerclage Cerclage of the cervix is a surgical procedure for an incompetent cervix. An incompetent cervix is a weak cervix that opens up before labor begins. Cerclage of the cervix sews the cervix closed during pregnancy.  LET [facility] CARE PROVIDER KNOW ABOUT:   Any allergies you have.  All medicines you are taking, including vitamins, herbs, eye drops, creams, and over-the-counter medicines.  Previous problems you or members of your family have had with the use of anesthetics.  Any blood disorders you have.  Previous surgeries you have had.  Medical conditions you have.  Any recent colds or infections. RISKS AND COMPLICATIONS  Generally, this is a safe procedure. However, as with any procedure, complications can occur. Possible complications include:  Infection.  Bleeding.  Rupturing the amniotic sac (membranes).  Going into early labor and delivery.  Problems with the anesthesia.  Infection of the amniotic sac. BEFORE THE PROCEDURE   Ask your health care provider about changing or stopping your medicines.  Do not eat or drink anything for 6 8 hours before the procedure.  Arrange for someone to drive you home after the procedure. PROCEDURE   An IV tube will be placed in your vein. You will be given a sedative to help you relax. You will be given a medicine that makes you sleep through the procedure (general anesthetic) or a medicine injected into your spine that numbs your body below the waist (spinal or epidural anesthetic). You will be asleep or be numbed through the entire procedure.  A speculum will be placed in vagina to visualize your cervix.  The cervix is then grasped and stitched closed tightly.  Ultrasound may be used to guide the procedure and monitor the baby. AFTER THE PROCEDURE   You will go to a recovery room where you and your unborn baby are monitored. Once you are awake, stable, and taking fluids well, you will be allowed to  return to your room.  You will usually stay in the hospital overnight.  You may get an injection of progesterone to prevent uterine contractions.  You may be given pain-relieving medicines to take with you when you go home.  Have someone drive you home and stay with you for up to 2 days. Document Released: 09/04/2008 Document Revised: 05/25/2013 Document Reviewed: 04/13/2013 [facility] Patient Information 2014 [place], [place].

## 2013-10-04 ENCOUNTER — Encounter (HOSPITAL_COMMUNITY): Payer: Medicaid Other | Admitting: Anesthesiology

## 2013-10-04 ENCOUNTER — Ambulatory Visit (HOSPITAL_COMMUNITY): Payer: Medicaid Other | Admitting: Anesthesiology

## 2013-10-04 ENCOUNTER — Ambulatory Visit (HOSPITAL_COMMUNITY)
Admission: RE | Admit: 2013-10-04 | Discharge: 2013-10-04 | Disposition: A | Discharge status: Home / Self Care | Payer: Medicaid Other | Source: Ambulatory Visit | Attending: Obstetrics and Gynecology | Admitting: Obstetrics and Gynecology

## 2013-10-04 ENCOUNTER — Encounter (HOSPITAL_COMMUNITY)
Admission: RE | Disposition: A | Discharge status: Home / Self Care | Payer: Self-pay | Source: Ambulatory Visit | Attending: Obstetrics and Gynecology

## 2013-10-04 ENCOUNTER — Encounter (HOSPITAL_COMMUNITY): Payer: Self-pay | Admitting: *Deleted

## 2013-10-04 DIAGNOSIS — O3430 Maternal care for cervical incompetence, unspecified trimester: Secondary | ICD-10-CM

## 2013-10-04 DIAGNOSIS — O09891 Supervision of other high risk pregnancies, first trimester: Secondary | ICD-10-CM

## 2013-10-04 DIAGNOSIS — O09219 Supervision of pregnancy with history of pre-term labor, unspecified trimester: Secondary | ICD-10-CM | POA: Insufficient documentation

## 2013-10-04 DIAGNOSIS — O09299 Supervision of pregnancy with other poor reproductive or obstetric history, unspecified trimester: Secondary | ICD-10-CM

## 2013-10-04 HISTORY — PX: CERVICAL CERCLAGE: SHX1329

## 2013-10-04 SURGERY — CERCLAGE, CERVIX, VAGINAL APPROACH
Anesthesia: Spinal | Site: Cervix

## 2013-10-04 MED ORDER — CEFAZOLIN SODIUM-DEXTROSE 2-3 GM-% IV SOLR
2.0000 g | INTRAVENOUS | Status: AC
  Administered 2013-10-04: 2 g via INTRAVENOUS
  Filled 2013-10-04: qty 50

## 2013-10-04 MED ORDER — 0.9 % SODIUM CHLORIDE (POUR BTL) OPTIME
TOPICAL | Status: DC | PRN
  Administered 2013-10-04: 1000 mL

## 2013-10-04 MED ORDER — ONDANSETRON HCL 4 MG/2ML IJ SOLN
4.0000 mg | Freq: Once | INTRAMUSCULAR | Status: AC
  Administered 2013-10-04: 4 mg via INTRAVENOUS

## 2013-10-04 MED ORDER — MIDAZOLAM HCL 2 MG/2ML IJ SOLN
1.0000 mg | INTRAMUSCULAR | Status: DC | PRN
  Administered 2013-10-04: 2 mg via INTRAVENOUS

## 2013-10-04 MED ORDER — LACTATED RINGERS IV SOLN
INTRAVENOUS | Status: DC
  Administered 2013-10-04: 1000 mL via INTRAVENOUS

## 2013-10-04 MED ORDER — PROPOFOL 10 MG/ML IV EMUL
INTRAVENOUS | Status: AC
  Filled 2013-10-04: qty 20

## 2013-10-04 MED ORDER — FENTANYL CITRATE 0.05 MG/ML IJ SOLN: 25.0000 ug | INTRAMUSCULAR | Status: DC | PRN

## 2013-10-04 MED ORDER — FENTANYL CITRATE 0.05 MG/ML IJ SOLN
INTRAMUSCULAR | Status: AC
  Filled 2013-10-04: qty 2

## 2013-10-04 MED ORDER — ONDANSETRON HCL 4 MG/2ML IJ SOLN
INTRAMUSCULAR | Status: AC
  Filled 2013-10-04: qty 2

## 2013-10-04 MED ORDER — LIDOCAINE IN DEXTROSE 5-7.5 % IV SOLN
INTRAVENOUS | Status: AC
  Filled 2013-10-04: qty 2

## 2013-10-04 MED ORDER — FENTANYL CITRATE 0.05 MG/ML IJ SOLN
25.0000 ug | INTRAMUSCULAR | Status: AC
  Administered 2013-10-04 (×2): 25 ug via INTRAVENOUS

## 2013-10-04 MED ORDER — ONDANSETRON HCL 4 MG/2ML IJ SOLN: 4.0000 mg | Freq: Once | INTRAMUSCULAR | Status: DC | PRN

## 2013-10-04 MED ORDER — LIDOCAINE IN DEXTROSE 5-7.5 % IV SOLN
INTRAVENOUS | Status: DC | PRN
  Administered 2013-10-04: 70 mg via INTRATHECAL

## 2013-10-04 MED ORDER — FENTANYL CITRATE 0.05 MG/ML IJ SOLN
INTRAMUSCULAR | Status: DC | PRN
  Administered 2013-10-04 (×5): 25 ug via INTRAVENOUS

## 2013-10-04 MED ORDER — MIDAZOLAM HCL 5 MG/5ML IJ SOLN
INTRAMUSCULAR | Status: DC | PRN
  Administered 2013-10-04 (×3): 0.5 mg via INTRAVENOUS

## 2013-10-04 MED ORDER — MIDAZOLAM HCL 2 MG/2ML IJ SOLN
INTRAMUSCULAR | Status: AC
  Filled 2013-10-04: qty 2

## 2013-10-04 SURGICAL SUPPLY — 20 items
BAG HAMPER (MISCELLANEOUS) ×2 IMPLANT
CATH ROBINSON RED A/P 12FR (CATHETERS) ×1 IMPLANT
CATH ROBINSON RED A/P 16FR (CATHETERS) IMPLANT
CLOTH BEACON ORANGE TIMEOUT ST (SAFETY) ×2 IMPLANT
COVER LIGHT HANDLE STERIS (MISCELLANEOUS) ×4 IMPLANT
DRAPE PROXIMA HALF (DRAPES) ×2 IMPLANT
GAUZE SPONGE 4X4 16PLY XRAY LF (GAUZE/BANDAGES/DRESSINGS) IMPLANT
GLOVE BIOGEL PI IND STRL 7.5 (GLOVE) IMPLANT
GLOVE BIOGEL PI INDICATOR 7.5 (GLOVE) ×1
GLOVE ECLIPSE 7.0 STRL STRAW (GLOVE) ×1 IMPLANT
GLOVE ECLIPSE 9.0 STRL (GLOVE) ×3 IMPLANT
GLOVE INDICATOR STER SZ 9 (GLOVE) ×2 IMPLANT
GOWN STRL REIN 3XL LVL4 (GOWN DISPOSABLE) ×2 IMPLANT
GOWN STRL REIN XL XLG (GOWN DISPOSABLE) ×2 IMPLANT
KIT ROOM TURNOVER AP CYSTO (KITS) ×2 IMPLANT
MANIFOLD NEPTUNE II (INSTRUMENTS) ×2 IMPLANT
NS IRRIG 1000ML POUR BTL (IV SOLUTION) ×2 IMPLANT
PACK PERI GYN (CUSTOM PROCEDURE TRAY) ×2 IMPLANT
PAD ARMBOARD 7.5X6 YLW CONV (MISCELLANEOUS) ×2 IMPLANT
SUT PROLENE 1 CT 1 30 (SUTURE) ×3 IMPLANT

## 2013-10-04 NOTE — Op Note (Signed)
10/04/2013  9:08 AM  PATIENT:  Megan Stevens  27 y.o. female  PRE-OPERATIVE DIAGNOSIS:  history of cervical incompetence  POST-OPERATIVE DIAGNOSIS:  history of cervical incompetence  PROCEDURE:  Procedure(s): CERCLAGE CERVICAL (N/A)  SURGEON:  Surgeon(s) and Role:    * Tilda Burrow, MD - Primary  Details of procedure: Patient was taken operating room spinal anesthesia introduced despite challenging patient anxiety limiting her ability to flex forward and cooperate with spinal effort. Perineum was prepped and draped, timeout conducted, Ancef administered x2 g. Speculum was inserted and the cervix visualized. Lateral retractors allowed a more effective visualization of the cervicovaginal edge, and #1 Prolene was placed in a circumferential loop just beneath the skin beginning at 11:00 and proceeding in a clockwise fashion around the cervix. A surgical knot was placed at 11:00 and and carefully cinched down such that a ring forceps could pass through the suture in the endocervical canal with slight resistance knot was then tied down securely and left long for future access. Bleeding was minimal. Patient to recovery room in stable condition.

## 2013-10-04 NOTE — Anesthesia Preprocedure Evaluation (Signed)
Anesthesia Evaluation  Patient identified by MRN, date of birth, ID band Patient awake    Reviewed: H&P , NPO status , Patient's Chart, lab work & pertinent test results  Airway Mallampati: II TM Distance: >3 FB Neck ROM: Full    Dental  (+) Teeth Intact   Pulmonary neg pulmonary ROS, Current Smoker,  breath sounds clear to auscultation        Cardiovascular negative cardio ROS  Rhythm:Regular Rate:Normal     Neuro/Psych    GI/Hepatic negative GI ROS,   Endo/Other    Renal/GU      Musculoskeletal   Abdominal   Peds  Hematology   Anesthesia Other Findings   Reproductive/Obstetrics                           Anesthesia Physical Anesthesia Plan  ASA: I  Anesthesia Plan: Spinal   Post-op Pain Management:    Induction:   Airway Management Planned: Nasal Cannula  Additional Equipment:   Intra-op Plan:   Post-operative Plan:   Informed Consent: I have reviewed the patients History and Physical, chart, labs and discussed the procedure including the risks, benefits and alternatives for the proposed anesthesia with the patient or authorized representative who has indicated his/her understanding and acceptance.     Plan Discussed with:   Anesthesia Plan Comments:         Anesthesia Quick Evaluation

## 2013-10-04 NOTE — Anesthesia Procedure Notes (Addendum)
Procedure Name: MAC Date/Time: 10/04/2013 7:51 AM Performed by: Franco Nones Pre-anesthesia Checklist: Patient identified, Emergency Drugs available, Suction available, Timeout performed and Patient being monitored Patient Re-evaluated:Patient Re-evaluated prior to inductionOxygen Delivery Method: Nasal Cannula    Spinal  Start time: 10/04/2013 8:05 AM End time: 10/04/2013 8:19 AM Staffing Anesthesiologist: Laurene Footman CRNA/Resident: Franco Nones Spinal Block Patient position: sitting (patient had difficulty sitting still and arching lumbar) Prep: Betadine Patient monitoring: heart rate, continuous pulse ox, cardiac monitor and blood pressure Approach: midline Location: L3-4 Injection technique: single-shot Needle Needle type: Sprotte  Needle gauge: 22 G Assessment Sensory level: T12 Additional Notes Betadine prep x 3 Attempts: 3 with 24G Sprotte 3 inch needle                  2 with 22G Sprotte 5 inch needle Tray Lot number [id number]                Expiration 2016-01 I attempted x 2 with 24 Sprotte. Dr Jayme Cloud in and 1 attempt made with 24 Sprotte, 2 attempts, one successful with 22 g Sprotte. CSF clear pre and post injection

## 2013-10-04 NOTE — Transfer of Care (Signed)
Immediate Anesthesia Transfer of Care Note  Patient: Megan Stevens  Procedure(s) Performed: Procedure(s) (LRB): CERCLAGE CERVICAL (N/A)  Patient Location: PACU  Anesthesia Type: SAB  Level of Consciousness: awake  Airway & Oxygen Therapy: Patient Spontanous Breathing and non-rebreather face mask  Post-op Assessment: Report given to PACU RN, Post -op Vital signs reviewed and stable. SAB Level  T10  Post vital signs: Reviewed and stable  Complications: No apparent anesthesia complications

## 2013-10-04 NOTE — Interval H&P Note (Signed)
History and Physical Interval Note:  10/04/2013 7:43 AM  Tawanna Sat  has presented today for surgery, with the diagnosis of cervical shortening  The various methods of treatment have been discussed with the patient and family. After consideration of risks, benefits and other options for treatment, the patient has consented to  Procedure(s): CERCLAGE CERVICAL (N/A) as a surgical intervention .  The patient's history has been reviewed, patient examined, no change in status, stable for surgery.  I have reviewed the patient's chart and labs.  Questions were answered to the patient's satisfaction.   The patient has been counseled for Spinal anesthesia, saddle block, and has had no change in condition since the office visit yesterday.   Tilda Burrow

## 2013-10-04 NOTE — Brief Op Note (Addendum)
10/04/2013  9:08 AM  PATIENT:  Megan Stevens  27 y.o. female  PRE-OPERATIVE DIAGNOSIS:  history of cervical incompetence  POST-OPERATIVE DIAGNOSIS:  history of cervical incompetence  PROCEDURE:  Procedure(s): CERCLAGE CERVICAL (N/A)  SURGEON:  Surgeon(s) and Role:    * Tilda Burrow, MD - Primary  PHYSICIAN ASSISTANT:   ASSISTANTS: none   ANESTHESIA:   spinal  EBL:  Total I/O In: 600 [I.V.:600] Out: 30 [Urine:30]  BLOOD ADMINISTERED:none  DRAINS: none   LOCAL MEDICATIONS USED:  NONE  SPECIMEN:  No Specimen  DISPOSITION OF SPECIMEN:  N/A  COUNTS:  YES  TOURNIQUET:  * No tourniquets in log *  DICTATION: .Dragon Dictation  PLAN OF CARE: Discharge to home after PACU  PATIENT DISPOSITION:  PACU - hemodynamically stable.   Delay start of Pharmacological VTE agent (>24hrs) due to surgical blood loss or risk of bleeding: not applicable

## 2013-10-04 NOTE — Progress Notes (Signed)
FHR- 148 mid lower abdomen.

## 2013-10-04 NOTE — Anesthesia Postprocedure Evaluation (Signed)
Anesthesia Post Note  Patient: Megan Stevens  Procedure(s) Performed: Procedure(s) (LRB): CERCLAGE CERVICAL (N/A)  Anesthesia type: Spinal  Patient location: PACU  Post pain: Pain level controlled  Post assessment: Post-op Vital signs reviewed, Patient's Cardiovascular Status Stable, Respiratory Function Stable, Patent Airway, No signs of Nausea or vomiting and Pain level controlled  Last Vitals:  Filed Vitals:   10/04/13 0903  BP: 105/64  Pulse: 88  Temp: 36.7 C  Resp: 20    Post vital signs: Reviewed and stable  Level of consciousness: awake and alert   Complications: No apparent anesthesia complications

## 2013-10-04 NOTE — Progress Notes (Signed)
Awake. Dressed self. No vag drainage. Peri pad dry. Denies pain.

## 2013-10-05 ENCOUNTER — Telehealth: Payer: Self-pay | Admitting: Obstetrics and Gynecology

## 2013-10-05 ENCOUNTER — Encounter (HOSPITAL_COMMUNITY): Payer: Self-pay | Admitting: Obstetrics and Gynecology

## 2013-10-05 NOTE — Telephone Encounter (Signed)
Spoke with pt. Had cerclage yesterday and complains of headache today. Advised to increase fluids and try Tylenol and see if that makes a difference. Advised to go by instructions on the bottle. Advised to call back if this don't help or call after hours nurse line. Pt voiced understanding. [name]

## 2013-10-05 NOTE — Telephone Encounter (Signed)
Left message x 1. [name] 

## 2013-10-07 ENCOUNTER — Telehealth: Payer: Self-pay | Admitting: *Deleted

## 2013-10-07 ENCOUNTER — Other Ambulatory Visit: Payer: Self-pay | Admitting: Obstetrics and Gynecology

## 2013-10-07 ENCOUNTER — Encounter: Payer: Self-pay | Admitting: Obstetrics and Gynecology

## 2013-10-07 DIAGNOSIS — O09299 Supervision of pregnancy with other poor reproductive or obstetric history, unspecified trimester: Secondary | ICD-10-CM

## 2013-10-07 DIAGNOSIS — O3432 Maternal care for cervical incompetence, second trimester: Secondary | ICD-10-CM

## 2013-10-07 DIAGNOSIS — Z1389 Encounter for screening for other disorder: Secondary | ICD-10-CM

## 2013-10-07 MED ORDER — HYDROCODONE-ACETAMINOPHEN 5-325 MG PO TABS: 1.0000 | ORAL_TABLET | Freq: Four times a day (QID) | ORAL | Status: DC | PRN

## 2013-10-07 NOTE — Telephone Encounter (Signed)
Pt with spinal h/a sx after spinal for surgery.  Pt is improving  Will Rx hydrocodone, and advise pt to increase caffeine intake.

## 2013-10-07 NOTE — Telephone Encounter (Signed)
Pt to come by and pick up Rx for vicodin for h/a , suspected spinal h/a

## 2013-10-07 NOTE — Telephone Encounter (Signed)
Pt c/o severe neck and headache pain, not improved with OTC meds. States had cerclage last Tuesday. Can we e-scribe pain meds.

## 2013-10-10 ENCOUNTER — Ambulatory Visit (INDEPENDENT_AMBULATORY_CARE_PROVIDER_SITE_OTHER): Payer: Medicaid Other | Admitting: Obstetrics and Gynecology

## 2013-10-10 ENCOUNTER — Other Ambulatory Visit: Payer: Self-pay | Admitting: Obstetrics and Gynecology

## 2013-10-10 ENCOUNTER — Ambulatory Visit (INDEPENDENT_AMBULATORY_CARE_PROVIDER_SITE_OTHER): Payer: Medicaid Other

## 2013-10-10 ENCOUNTER — Encounter: Payer: Self-pay | Admitting: Obstetrics and Gynecology

## 2013-10-10 VITALS — BP 114/64 | Wt 182.2 lb

## 2013-10-10 DIAGNOSIS — O0991 Supervision of high risk pregnancy, unspecified, first trimester: Secondary | ICD-10-CM

## 2013-10-10 DIAGNOSIS — O09299 Supervision of pregnancy with other poor reproductive or obstetric history, unspecified trimester: Secondary | ICD-10-CM

## 2013-10-10 DIAGNOSIS — Z8751 Personal history of pre-term labor: Secondary | ICD-10-CM

## 2013-10-10 DIAGNOSIS — O3432 Maternal care for cervical incompetence, second trimester: Secondary | ICD-10-CM

## 2013-10-10 DIAGNOSIS — O99891 Other specified diseases and conditions complicating pregnancy: Secondary | ICD-10-CM

## 2013-10-10 DIAGNOSIS — O09219 Supervision of pregnancy with history of pre-term labor, unspecified trimester: Secondary | ICD-10-CM

## 2013-10-10 DIAGNOSIS — O2620 Pregnancy care for patient with recurrent pregnancy loss, unspecified trimester: Secondary | ICD-10-CM

## 2013-10-10 DIAGNOSIS — O9989 Other specified diseases and conditions complicating pregnancy, childbirth and the puerperium: Secondary | ICD-10-CM

## 2013-10-10 DIAGNOSIS — Z1389 Encounter for screening for other disorder: Secondary | ICD-10-CM

## 2013-10-10 DIAGNOSIS — O4400 Placenta previa specified as without hemorrhage, unspecified trimester: Secondary | ICD-10-CM

## 2013-10-10 DIAGNOSIS — O34219 Maternal care for unspecified type scar from previous cesarean delivery: Secondary | ICD-10-CM

## 2013-10-10 DIAGNOSIS — O3430 Maternal care for cervical incompetence, unspecified trimester: Secondary | ICD-10-CM

## 2013-10-10 DIAGNOSIS — Z331 Pregnant state, incidental: Secondary | ICD-10-CM

## 2013-10-10 LAB — POCT URINALYSIS DIPSTICK
Blood, UA: NEGATIVE
Glucose, UA: NEGATIVE
KETONES UA: NEGATIVE
Nitrite, UA: NEGATIVE
PROTEIN UA: NEGATIVE

## 2013-10-10 MED ORDER — HYDROXYPROGESTERONE CAPROATE 250 MG/ML IM OIL
250.0000 mg | TOPICAL_OIL | Freq: Once | INTRAMUSCULAR | Status: AC
  Administered 2013-10-10: 250 mg via INTRAMUSCULAR

## 2013-10-10 NOTE — Patient Instructions (Signed)
Preterm Labor Information Preterm labor is when labor starts at less than 37 weeks of pregnancy. The normal length of a pregnancy is 39 to 41 weeks. CAUSES Often, there is no identifiable underlying cause as to why a woman goes into preterm labor. One of the most common known causes of preterm labor is infection. Infections of the uterus, cervix, vagina, amniotic sac, bladder, kidney, or even the lungs (pneumonia) can cause labor to start. Other suspected causes of preterm labor include:   Urogenital infections, such as yeast infections and bacterial vaginosis.   Uterine abnormalities (uterine shape, uterine septum, fibroids, or bleeding from the placenta).   A cervix that has been operated on (it may fail to stay closed).   Malformations in the fetus.   Multiple gestations (twins, triplets, and so on).   Breakage of the amniotic sac.  RISK FACTORS  Having a previous history of preterm labor.   Having premature rupture of membranes (PROM).   Having a placenta that covers the opening of the cervix (placenta previa).   Having a placenta that separates from the uterus (placental abruption).   Having a cervix that is too weak to hold the fetus in the uterus (incompetent cervix).   Having too much fluid in the amniotic sac (polyhydramnios).   Taking illegal drugs or smoking while pregnant.   Not gaining enough weight while pregnant.   Being younger than 18 and older than 27 years old.   Having a low socioeconomic status.   Being African American. SYMPTOMS Signs and symptoms of preterm labor include:   Menstrual-like cramps, abdominal pain, or back pain.  Uterine contractions that are regular, as frequent as six in an hour, regardless of their intensity (may be mild or painful).  Contractions that start on the top of the uterus and spread down to the lower abdomen and back.   A sense of increased pelvic pressure.   A watery or bloody mucus discharge that  comes from the vagina.  TREATMENT Depending on the length of the pregnancy and other circumstances, your health care provider may suggest bed rest. If necessary, there are medicines that can be given to stop contractions and to mature the fetal lungs. If labor happens before 34 weeks of pregnancy, a prolonged hospital stay may be recommended. Treatment depends on the condition of both you and the fetus.  WHAT SHOULD YOU DO IF YOU THINK YOU ARE IN PRETERM LABOR? Call your health care provider right away. You will need to go to the hospital to get checked immediately. HOW CAN YOU PREVENT PRETERM LABOR IN FUTURE PREGNANCIES? You should:   Stop smoking if you smoke.  Maintain healthy weight gain and avoid chemicals and drugs that are not necessary.  Be watchful for any type of infection.  Inform your health care provider if you have a known history of preterm labor. Document Released: 12/13/2003 Document Revised: 05/25/2013 Document Reviewed: 10/25/2012 [employer] Patient Information 2014 [employer].    

## 2013-10-10 NOTE — Addendum Note (Signed)
Addended by: Gaylyn RongEVANS, Rodney Wigger A on: 10/10/2013 12:02 PM   Modules accepted: Orders

## 2013-10-10 NOTE — Progress Notes (Signed)
U/s reviewed with the pt. Normal fetal anatomy No previa, no cervix opening on u/s cx length 1.9 cm. Cerclage in place   She denies any cramping or vaginal bleeding since the cerclage procedure. Exam limited by patients discomfort and nervousness. Digital exam: Cervix is posterior and feels long and closed.   Preterm labor precautions reviewed with pt. She understands and agrees.

## 2013-10-10 NOTE — Progress Notes (Signed)
U/S(19+3wks)-active vtx fetus, meas c/w dates, fluid wnl, anterior Gr 0 placenta, bilateral adnexa appear WNL, female fetus, no obvious abnl noted although sub-optimal views due to fetal position, FHR-144bpm CX-(measured transvaginally)- 1.9cm and small amount of funneling noted with fundal pressure applied, stitch noted and cx appears closed

## 2013-10-15 ENCOUNTER — Encounter: Payer: Self-pay | Admitting: Obstetrics and Gynecology

## 2013-10-15 LAB — MATERNAL SCREEN, INTEGRATED #2
AFP MoM: 1.16
AFP, SERUM MAT SCREEN: 72.7 ng/mL
Age risk Down Syndrome: 1:990 {titer}
Calculated Gestational Age: 20
Crown Rump Length: 68.5 mm
ESTRIOL MOM MAT SCREEN: 1.52
Estriol, Free: 2.66 ng/mL
Inhibin A Dimeric: 375 pg/mL
Inhibin A MoM: 1.99
MSS Trisomy 18 Risk: <1:5000 {titer}
NT MoM: 1.1
NUMBER OF FETUSES MAT SCREEN 2: 1
Nuchal Translucency: 1.68 mm
PAPP-A MAT SCREEN: 580 ng/mL
PAPP-A MOM MAT SCREEN: 0.43
Rish for ONTD: 1:5000 {titer}
hCG MoM: 0.7
hCG, Serum: 14.8 IU/mL

## 2013-10-17 ENCOUNTER — Encounter: Payer: Self-pay | Admitting: Obstetrics & Gynecology

## 2013-10-17 ENCOUNTER — Ambulatory Visit (INDEPENDENT_AMBULATORY_CARE_PROVIDER_SITE_OTHER): Payer: Medicaid Other | Admitting: Obstetrics & Gynecology

## 2013-10-17 VITALS — BP 108/60 | Wt 186.0 lb

## 2013-10-17 DIAGNOSIS — O26879 Cervical shortening, unspecified trimester: Secondary | ICD-10-CM

## 2013-10-17 DIAGNOSIS — O4400 Placenta previa specified as without hemorrhage, unspecified trimester: Secondary | ICD-10-CM

## 2013-10-17 DIAGNOSIS — O34219 Maternal care for unspecified type scar from previous cesarean delivery: Secondary | ICD-10-CM

## 2013-10-17 DIAGNOSIS — O2620 Pregnancy care for patient with recurrent pregnancy loss, unspecified trimester: Secondary | ICD-10-CM

## 2013-10-17 DIAGNOSIS — O99891 Other specified diseases and conditions complicating pregnancy: Secondary | ICD-10-CM

## 2013-10-17 DIAGNOSIS — O09299 Supervision of pregnancy with other poor reproductive or obstetric history, unspecified trimester: Secondary | ICD-10-CM

## 2013-10-17 DIAGNOSIS — Z1389 Encounter for screening for other disorder: Secondary | ICD-10-CM

## 2013-10-17 DIAGNOSIS — O09219 Supervision of pregnancy with history of pre-term labor, unspecified trimester: Secondary | ICD-10-CM

## 2013-10-17 DIAGNOSIS — O26872 Cervical shortening, second trimester: Secondary | ICD-10-CM

## 2013-10-17 DIAGNOSIS — O9989 Other specified diseases and conditions complicating pregnancy, childbirth and the puerperium: Secondary | ICD-10-CM

## 2013-10-17 DIAGNOSIS — Z331 Pregnant state, incidental: Secondary | ICD-10-CM

## 2013-10-17 LAB — POCT URINALYSIS DIPSTICK
Glucose, UA: NEGATIVE
KETONES UA: NEGATIVE
Leukocytes, UA: NEGATIVE
Nitrite, UA: NEGATIVE
RBC UA: NEGATIVE

## 2013-10-17 MED ORDER — HYDROXYPROGESTERONE CAPROATE 250 MG/ML IM OIL
250.0000 mg | TOPICAL_OIL | Freq: Once | INTRAMUSCULAR | Status: AC
  Administered 2013-10-17: 250 mg via INTRAMUSCULAR

## 2013-10-17 MED ORDER — PROGESTERONE MICRONIZED 200 MG PO CAPS: ORAL_CAPSULE | ORAL | Status: DC

## 2013-10-17 NOTE — Addendum Note (Signed)
Addended by: Colen DarlingYOUNG, Dyron Kawano S on: 10/17/2013 04:43 PM   Modules accepted: Orders

## 2013-10-17 NOTE — Progress Notes (Signed)
BP weight and urine results all reviewed and noted. Patient reports good fetal movement, denies any bleeding and no rupture of membranes symptoms or regular contractions. Patient is without complaints. All questions were answered. Will also add prometrium with cervical length of 1.9 cm

## 2013-10-17 NOTE — Addendum Note (Signed)
Addended by: Richardson ChiquitoRAVIS, Zakira Ressel M on: 10/17/2013 03:39 PM   Modules accepted: Orders

## 2013-10-21 ENCOUNTER — Telehealth: Payer: Self-pay | Admitting: *Deleted

## 2013-10-21 DIAGNOSIS — Z8742 Personal history of other diseases of the female genital tract: Secondary | ICD-10-CM

## 2013-10-21 DIAGNOSIS — Z8759 Personal history of other complications of pregnancy, childbirth and the puerperium: Principal | ICD-10-CM

## 2013-10-21 NOTE — Telephone Encounter (Signed)
Pt stated that she has been having pelvic pressure since last nice. Pt denies any bleeding or gush of fluid. Pt states that she has good fetal movement. I advised pt that because of her history and cerclage and what she is describing, [facility] just to be safe. Pt verbalized understanding.

## 2013-10-24 ENCOUNTER — Ambulatory Visit (INDEPENDENT_AMBULATORY_CARE_PROVIDER_SITE_OTHER): Payer: Medicaid Other | Admitting: Adult Health

## 2013-10-24 ENCOUNTER — Ambulatory Visit: Payer: Medicaid Other

## 2013-10-24 ENCOUNTER — Encounter: Payer: Self-pay | Admitting: Adult Health

## 2013-10-24 VITALS — BP 120/76 | Ht 65.0 in | Wt 189.0 lb

## 2013-10-24 DIAGNOSIS — O09219 Supervision of pregnancy with history of pre-term labor, unspecified trimester: Secondary | ICD-10-CM

## 2013-10-24 DIAGNOSIS — Z1389 Encounter for screening for other disorder: Secondary | ICD-10-CM

## 2013-10-24 DIAGNOSIS — Z331 Pregnant state, incidental: Secondary | ICD-10-CM

## 2013-10-24 DIAGNOSIS — O09899 Supervision of other high risk pregnancies, unspecified trimester: Secondary | ICD-10-CM

## 2013-10-24 LAB — POCT URINALYSIS DIPSTICK
Glucose, UA: NEGATIVE
KETONES UA: NEGATIVE
Leukocytes, UA: NEGATIVE
Nitrite, UA: NEGATIVE
PROTEIN UA: NEGATIVE
RBC UA: NEGATIVE

## 2013-10-24 MED ORDER — HYDROXYPROGESTERONE CAPROATE 250 MG/ML IM OIL
250.0000 mg | TOPICAL_OIL | Freq: Once | INTRAMUSCULAR | Status: AC
  Administered 2013-10-24: 250 mg via INTRAMUSCULAR

## 2013-10-31 ENCOUNTER — Other Ambulatory Visit: Payer: Self-pay | Admitting: Obstetrics & Gynecology

## 2013-10-31 ENCOUNTER — Encounter: Payer: Self-pay | Admitting: Women's Health

## 2013-10-31 ENCOUNTER — Ambulatory Visit (INDEPENDENT_AMBULATORY_CARE_PROVIDER_SITE_OTHER): Payer: Medicaid Other | Admitting: Women's Health

## 2013-10-31 ENCOUNTER — Ambulatory Visit (INDEPENDENT_AMBULATORY_CARE_PROVIDER_SITE_OTHER): Payer: Medicaid Other

## 2013-10-31 VITALS — BP 100/58 | Wt 187.0 lb

## 2013-10-31 DIAGNOSIS — O26879 Cervical shortening, unspecified trimester: Secondary | ICD-10-CM | POA: Insufficient documentation

## 2013-10-31 DIAGNOSIS — Z8742 Personal history of other diseases of the female genital tract: Secondary | ICD-10-CM

## 2013-10-31 DIAGNOSIS — Z331 Pregnant state, incidental: Secondary | ICD-10-CM

## 2013-10-31 DIAGNOSIS — O34219 Maternal care for unspecified type scar from previous cesarean delivery: Secondary | ICD-10-CM

## 2013-10-31 DIAGNOSIS — O3430 Maternal care for cervical incompetence, unspecified trimester: Secondary | ICD-10-CM

## 2013-10-31 DIAGNOSIS — O0991 Supervision of high risk pregnancy, unspecified, first trimester: Secondary | ICD-10-CM

## 2013-10-31 DIAGNOSIS — Z23 Encounter for immunization: Secondary | ICD-10-CM

## 2013-10-31 DIAGNOSIS — O2620 Pregnancy care for patient with recurrent pregnancy loss, unspecified trimester: Secondary | ICD-10-CM

## 2013-10-31 DIAGNOSIS — O26872 Cervical shortening, second trimester: Secondary | ICD-10-CM

## 2013-10-31 DIAGNOSIS — O09299 Supervision of pregnancy with other poor reproductive or obstetric history, unspecified trimester: Secondary | ICD-10-CM

## 2013-10-31 DIAGNOSIS — Z8759 Personal history of other complications of pregnancy, childbirth and the puerperium: Secondary | ICD-10-CM

## 2013-10-31 DIAGNOSIS — O99891 Other specified diseases and conditions complicating pregnancy: Secondary | ICD-10-CM

## 2013-10-31 DIAGNOSIS — O9989 Other specified diseases and conditions complicating pregnancy, childbirth and the puerperium: Secondary | ICD-10-CM

## 2013-10-31 DIAGNOSIS — Z98891 History of uterine scar from previous surgery: Secondary | ICD-10-CM

## 2013-10-31 DIAGNOSIS — Z1389 Encounter for screening for other disorder: Secondary | ICD-10-CM

## 2013-10-31 DIAGNOSIS — O09219 Supervision of pregnancy with history of pre-term labor, unspecified trimester: Secondary | ICD-10-CM

## 2013-10-31 DIAGNOSIS — O4400 Placenta previa specified as without hemorrhage, unspecified trimester: Secondary | ICD-10-CM

## 2013-10-31 LAB — POCT URINALYSIS DIPSTICK
GLUCOSE UA: NEGATIVE
KETONES UA: NEGATIVE
Leukocytes, UA: NEGATIVE
Nitrite, UA: NEGATIVE
Protein, UA: NEGATIVE
RBC UA: NEGATIVE

## 2013-10-31 MED ORDER — HYDROXYPROGESTERONE CAPROATE 250 MG/ML IM OIL: 250.0000 mg | TOPICAL_OIL | Freq: Once | INTRAMUSCULAR | Status: DC

## 2013-10-31 MED ORDER — HYDROXYPROGESTERONE CAPROATE 250 MG/ML IM OIL
250.0000 mg | TOPICAL_OIL | Freq: Once | INTRAMUSCULAR | Status: AC
  Administered 2013-10-31: 250 mg via INTRAMUSCULAR

## 2013-10-31 MED ORDER — INFLUENZA VAC SPLIT QUAD 0.5 ML IM SUSP
0.5000 mL | Freq: Once | INTRAMUSCULAR | Status: AC
  Administered 2013-10-31: 0.5 mL via INTRAMUSCULAR

## 2013-10-31 NOTE — Progress Notes (Signed)
U/S(22+3wks)-CX-1.4cm closed, stitch noted, vtx active fetus, FHR-149 bpm, anterior Gr 0 placenta, female fetus

## 2013-10-31 NOTE — Patient Instructions (Addendum)
[place] Pediatricians:  [facility] [phone]            [facility] [phone]                 [facility] [phone] (usually doesn't accept new patients unless you have family there already, you are always welcome to call and ask)             [facility] ([address] [place]) [phone]   [facility]:   [facility]: [phone]  [facility]: [phone]   Cervical Insufficiency  Cervical insufficiency is when the cervix is weak and starts to open (dilate) and thin (efface) before the pregnancy is at term and without labor starting. This is also called incompetent cervix. It can happen in the second or third trimester when the fetus starts putting pressure on the cervix. Cervical insufficiency can lead to a miscarriage, preterm premature rupture of the membranes (PPROM), or having the baby early (preterm birth).  RISK FACTORS You may be more likely to develop cervical insufficiency if:  You have a shorter cervix than normal.  Damage or injury occurred to your cervix from a past pregnancy or surgery.  You were born with a cervical defect.  You have had procedure done on the cervix, such as cervical biopsy.  You have a history of cervical insufficiency.  You have a history of PPROM.  You have ended several past pregnancies through abortion.  You were exposed to the drug diethylstilbestrol (DES). SYMPTOMS Often times, women do not have any symptoms. Other times, woman may only have mild symptoms that often start between week 14 through 20. The symptoms may last several days or weeks. These symptoms include:  Light spotting or bleeding from the vagina.  Pelvic pressure.  A change in vaginal discharge, such as discharge that changes from clear, white, or light yellow to pink or tan.  Back pain.  Abdominal pain or cramping. DIAGNOSIS Cervical  insufficiency cannot be diagnosed before you become pregnant. Once you are pregnant, your caregiver will ask about your medical history and if you have had any problems in past pregnancies. Tell your caregiver about any procedures performed on your cervix or if you have a history of miscarriages or cervical insufficiency. If your caregiver thinks you are at high risk for cervical insufficiency or show signs of cervical insufficiency, he or she may:  Perform a pelvic exam. This will check for:  The presence of the membranes (amniotic sac) coming out of the cervix.  Cervical abnormalities.  Cervical injuries.  The presence of contractions.  Perform an ultrasonography (commonly called ultrasound) to measure the length and thickness of the cervix. TREATMENT If you have been diagnosed with cervical insufficiency, your caregiver may recommend:  Limiting physical activity.  Bed rest at home or in the hospital.  Pelvic rest, which means no sexual intercourse or placing anything in the vagina.  Cerclage to sew the cervix closed and prevent it from opening too early. The stitches (sutures) are removed between weeks 36 and 38 to avoid problems during labor. Cerclage may be recommended during pregnancy if you have had a history of miscarriages or preterm births without a known cause. It may also be recommended if you have a short cervix that was identified by ultrasound or if your caregiver has found that your cervix has dilated before 24 weeks of pregnancy. Limiting physical activity and bed rest may or may not help prevent a  preterm birth. WHEN SHOULD YOU SEEK IMMEDIATE MEDICAL CARE?  Seek immediate medical care if you show any symptoms of cervical insufficiency. You will need to go to the hospital to get checked immediately. Document Released: 09/22/2005 Document Revised: 05/25/2013 Document Reviewed: 11/29/2012 [facility] Patient Information 2014 [place]

## 2013-10-31 NOTE — Progress Notes (Signed)
Reports good fm. Denies uc's, lof, vb, uti s/s.  No complaints.  Reviewed today's CL u/s which reveals cx has shortened from 1.9cm to 1.4cm, is still closed, cerclage noted, no mention of funneling.  Discussed w/ JVF. Pt is already getting 17P q Monday. Had been on prometrium earlier in pregnancy, per pt was recommended by [facility] 2wks ago to restart in addition to 17P, so she began again yesterday. Still walking to q appt, states it takes her about 30mins. She is also working 40hr/wk at [employer] as part of [employer] program, but states it is ok if she stops this. Note given to stop working d/t complications of pregnancy. Lengthy discussion about possible transportation to appts. Pt will come q Mon at 0830 for 17P/appts, she is to call [facility] to schedule transportation.  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 Recommended that she cut back some.   All questions answered. Reviewed ptl s/s, fm. F/U in 1wk for 17P, 2wks for CL u/s and visit.

## 2013-11-03 ENCOUNTER — Telehealth: Payer: Self-pay

## 2013-11-03 MED ORDER — BUTALBITAL-APAP-CAFFEINE 50-325-40 MG PO TABS: 1.0000 | ORAL_TABLET | Freq: Three times a day (TID) | ORAL | Status: DC | PRN

## 2013-11-03 NOTE — Telephone Encounter (Addendum)
Pt states continues to have HA not relieved by tylenol. Pt saw Joellyn HaffKim Booker, CNM on 10/31/2013 but states did not mention frequent HA, Blood pressures are WNL for all her prenatal visits.  Can we e-scribe Fioricet?   rx sent.  11:24 AM CRESENZO-DISHMAN,FRANCES

## 2013-11-03 NOTE — Telephone Encounter (Signed)
Pt aware Rx for Fioricet at front desk for pick up.

## 2013-11-07 ENCOUNTER — Ambulatory Visit (INDEPENDENT_AMBULATORY_CARE_PROVIDER_SITE_OTHER): Payer: Medicaid Other | Admitting: Adult Health

## 2013-11-07 ENCOUNTER — Encounter: Payer: Self-pay | Admitting: Adult Health

## 2013-11-07 VITALS — BP 104/60 | Ht 65.0 in | Wt 190.8 lb

## 2013-11-07 DIAGNOSIS — Z331 Pregnant state, incidental: Secondary | ICD-10-CM

## 2013-11-07 DIAGNOSIS — Z8759 Personal history of other complications of pregnancy, childbirth and the puerperium: Secondary | ICD-10-CM

## 2013-11-07 DIAGNOSIS — Z8742 Personal history of other diseases of the female genital tract: Secondary | ICD-10-CM

## 2013-11-07 DIAGNOSIS — Z8751 Personal history of pre-term labor: Secondary | ICD-10-CM

## 2013-11-07 DIAGNOSIS — O26879 Cervical shortening, unspecified trimester: Secondary | ICD-10-CM

## 2013-11-07 DIAGNOSIS — Z1389 Encounter for screening for other disorder: Secondary | ICD-10-CM

## 2013-11-07 LAB — POCT URINALYSIS DIPSTICK
Blood, UA: NEGATIVE
GLUCOSE UA: NEGATIVE
Ketones, UA: NEGATIVE
Nitrite, UA: NEGATIVE
Protein, UA: NEGATIVE

## 2013-11-07 MED ORDER — HYDROXYPROGESTERONE CAPROATE 250 MG/ML IM OIL
250.0000 mg | TOPICAL_OIL | Freq: Once | INTRAMUSCULAR | Status: AC
  Administered 2013-11-07: 250 mg via INTRAMUSCULAR

## 2013-11-07 NOTE — Progress Notes (Signed)
Patient ID: Megan Stevens, female   DOB: 09/25/1986, 28 y.o.   MRN: [id number] Pt given 17 P injection in Left deltoid with no complications, no complaints at this time. Pt to return in 1 week for 17 P only.

## 2013-11-14 ENCOUNTER — Other Ambulatory Visit: Payer: Self-pay | Admitting: Obstetrics & Gynecology

## 2013-11-14 ENCOUNTER — Encounter: Payer: Self-pay | Admitting: Women's Health

## 2013-11-14 ENCOUNTER — Ambulatory Visit (INDEPENDENT_AMBULATORY_CARE_PROVIDER_SITE_OTHER): Payer: Medicaid Other

## 2013-11-14 ENCOUNTER — Ambulatory Visit (INDEPENDENT_AMBULATORY_CARE_PROVIDER_SITE_OTHER): Payer: Medicaid Other | Admitting: Women's Health

## 2013-11-14 VITALS — BP 100/60 | Wt 192.0 lb

## 2013-11-14 DIAGNOSIS — O26879 Cervical shortening, unspecified trimester: Secondary | ICD-10-CM

## 2013-11-14 DIAGNOSIS — Z1389 Encounter for screening for other disorder: Secondary | ICD-10-CM

## 2013-11-14 DIAGNOSIS — O2620 Pregnancy care for patient with recurrent pregnancy loss, unspecified trimester: Secondary | ICD-10-CM

## 2013-11-14 DIAGNOSIS — O09219 Supervision of pregnancy with history of pre-term labor, unspecified trimester: Secondary | ICD-10-CM

## 2013-11-14 DIAGNOSIS — O4400 Placenta previa specified as without hemorrhage, unspecified trimester: Secondary | ICD-10-CM

## 2013-11-14 DIAGNOSIS — O09299 Supervision of pregnancy with other poor reproductive or obstetric history, unspecified trimester: Secondary | ICD-10-CM

## 2013-11-14 DIAGNOSIS — O3430 Maternal care for cervical incompetence, unspecified trimester: Secondary | ICD-10-CM

## 2013-11-14 DIAGNOSIS — O9989 Other specified diseases and conditions complicating pregnancy, childbirth and the puerperium: Secondary | ICD-10-CM

## 2013-11-14 DIAGNOSIS — O0990 Supervision of high risk pregnancy, unspecified, unspecified trimester: Secondary | ICD-10-CM

## 2013-11-14 DIAGNOSIS — O99891 Other specified diseases and conditions complicating pregnancy: Secondary | ICD-10-CM

## 2013-11-14 DIAGNOSIS — Z331 Pregnant state, incidental: Secondary | ICD-10-CM

## 2013-11-14 LAB — POCT URINALYSIS DIPSTICK
Blood, UA: NEGATIVE
Glucose, UA: NEGATIVE
KETONES UA: NEGATIVE
LEUKOCYTES UA: NEGATIVE
Nitrite, UA: NEGATIVE

## 2013-11-14 MED ORDER — HYDROXYPROGESTERONE CAPROATE 250 MG/ML IM OIL
250.0000 mg | TOPICAL_OIL | Freq: Once | INTRAMUSCULAR | Status: AC
  Administered 2013-11-14: 250 mg via INTRAMUSCULAR

## 2013-11-14 NOTE — Patient Instructions (Signed)
Preterm Labor Information Preterm labor is when labor starts at less than 37 weeks of pregnancy. The normal length of a pregnancy is 39 to 41 weeks. CAUSES Often, there is no identifiable underlying cause as to why a woman goes into preterm labor. One of the most common known causes of preterm labor is infection. Infections of the uterus, cervix, vagina, amniotic sac, bladder, kidney, or even the lungs (pneumonia) can cause labor to start. Other suspected causes of preterm labor include:   Urogenital infections, such as yeast infections and bacterial vaginosis.   Uterine abnormalities (uterine shape, uterine septum, fibroids, or bleeding from the placenta).   A cervix that has been operated on (it may fail to stay closed).   Malformations in the fetus.   Multiple gestations (twins, triplets, and so on).   Breakage of the amniotic sac.  RISK FACTORS  Having a previous history of preterm labor.   Having premature rupture of membranes (PROM).   Having a placenta that covers the opening of the cervix (placenta previa).   Having a placenta that separates from the uterus (placental abruption).   Having a cervix that is too weak to hold the fetus in the uterus (incompetent cervix).   Having too much fluid in the amniotic sac (polyhydramnios).   Taking illegal drugs or smoking while pregnant.   Not gaining enough weight while pregnant.   Being younger than 21 and older than 27 years old.   Having a low socioeconomic status.   Being African American. SYMPTOMS Signs and symptoms of preterm labor include:   Menstrual-like cramps, abdominal pain, or back pain.  Uterine contractions that are regular, as frequent as six in an hour, regardless of their intensity (may be mild or painful).  Contractions that start on the top of the uterus and spread down to the lower abdomen and back.   A sense of increased pelvic pressure.   A watery or bloody mucus discharge that  comes from the vagina.  TREATMENT Depending on the length of the pregnancy and other circumstances, your health care provider may suggest bed rest. If necessary, there are medicines that can be given to stop contractions and to mature the fetal lungs. If labor happens before 34 weeks of pregnancy, a prolonged hospital stay may be recommended. Treatment depends on the condition of both you and the fetus.  WHAT SHOULD YOU DO IF YOU THINK YOU ARE IN PRETERM LABOR? Call your health care provider right away. You will need to go to the hospital to get checked immediately. HOW CAN YOU PREVENT PRETERM LABOR IN FUTURE PREGNANCIES? You should:   Stop smoking if you smoke.  Maintain healthy weight gain and avoid chemicals and drugs that are not necessary.  Be watchful for any type of infection.  Inform your health care provider if you have a known history of preterm labor. Document Released: 12/13/2003 Document Revised: 05/25/2013 Document Reviewed: 10/25/2012 [facility] Patient Information 2014 [place], [place].  Cervical Insufficiency  Cervical insufficiency is when the cervix is weak and starts to open (dilate) and thin (efface) before the pregnancy is at term and without labor starting. This is also called incompetent cervix. It can happen in the second or third trimester when the fetus starts putting pressure on the cervix. Cervical insufficiency can lead to a miscarriage, preterm premature rupture of the membranes (PPROM), or having the baby early (preterm birth).  RISK FACTORS You may be more likely to develop cervical insufficiency if:  You have a shorter cervix  than normal.  Damage or injury occurred to your cervix from a past pregnancy or surgery.  You were born with a cervical defect.  You have had procedure done on the cervix, such as cervical biopsy.  You have a history of cervical insufficiency.  You have a history of PPROM.  You have ended several past pregnancies through  abortion.  You were exposed to the drug diethylstilbestrol (DES). SYMPTOMS Often times, women do not have any symptoms. Other times, woman may only have mild symptoms that often start between week 14 through 20. The symptoms may last several days or weeks. These symptoms include:  Light spotting or bleeding from the vagina.  Pelvic pressure.  A change in vaginal discharge, such as discharge that changes from clear, white, or light yellow to pink or tan.  Back pain.  Abdominal pain or cramping. DIAGNOSIS Cervical insufficiency cannot be diagnosed before you become pregnant. Once you are pregnant, your caregiver will ask about your medical history and if you have had any problems in past pregnancies. Tell your caregiver about any procedures performed on your cervix or if you have a history of miscarriages or cervical insufficiency. If your caregiver thinks you are at high risk for cervical insufficiency or show signs of cervical insufficiency, he or she may:  Perform a pelvic exam. This will check for:  The presence of the membranes (amniotic sac) coming out of the cervix.  Cervical abnormalities.  Cervical injuries.  The presence of contractions.  Perform an ultrasonography (commonly called ultrasound) to measure the length and thickness of the cervix. TREATMENT If you have been diagnosed with cervical insufficiency, your caregiver may recommend:  Limiting physical activity.  Bed rest at home or in the hospital.  Pelvic rest, which means no sexual intercourse or placing anything in the vagina.  Cerclage to sew the cervix closed and prevent it from opening too early. The stitches (sutures) are removed between weeks 36 and 38 to avoid problems during labor. Cerclage may be recommended during pregnancy if you have had a history of miscarriages or preterm births without a known cause. It may also be recommended if you have a short cervix that was identified by ultrasound or if your  caregiver has found that your cervix has dilated before 24 weeks of pregnancy. Limiting physical activity and bed rest may or may not help prevent a preterm birth. WHEN SHOULD YOU SEEK IMMEDIATE MEDICAL CARE?  Seek immediate medical care if you show any symptoms of cervical insufficiency. You will need to go to the hospital to get checked immediately. Document Released: 09/22/2005 Document Revised: 05/25/2013 Document Reviewed: 11/29/2012 [facility] Patient Information 2014 [place], [place].

## 2013-11-14 NOTE — Progress Notes (Signed)
U/S(24+3wks)-vtx active fetus, FHR-142 bpm, fluid appears WNL, anterior Gr 1 placenta, EFW 1 lb 10 oz (53rd%tile), CX (measured vaginally)=1.16cm closed (previous measurement 1.4cm 2 weeks ago)

## 2013-11-14 NOTE — Progress Notes (Signed)
Reports good fm. Denies uc's, lof, vb, uti s/s.  No complaints.  Has started taking public transportation to appts. Not working anymore. Modified br at home. CL today 1.16cm, cerclage intact. Discussed w/ JVF, continue current management. Reviewed ptl s/s, fm.  All questions answered. F/U in 2wks for CL and visit.

## 2013-11-21 ENCOUNTER — Encounter: Payer: Self-pay | Admitting: Adult Health

## 2013-11-21 ENCOUNTER — Ambulatory Visit (INDEPENDENT_AMBULATORY_CARE_PROVIDER_SITE_OTHER): Payer: Medicaid Other | Admitting: Adult Health

## 2013-11-21 VITALS — BP 120/76 | Ht 65.0 in | Wt 191.0 lb

## 2013-11-21 DIAGNOSIS — O09219 Supervision of pregnancy with history of pre-term labor, unspecified trimester: Secondary | ICD-10-CM

## 2013-11-21 DIAGNOSIS — O26879 Cervical shortening, unspecified trimester: Secondary | ICD-10-CM

## 2013-11-21 DIAGNOSIS — O09899 Supervision of other high risk pregnancies, unspecified trimester: Secondary | ICD-10-CM

## 2013-11-21 DIAGNOSIS — Z8759 Personal history of other complications of pregnancy, childbirth and the puerperium: Secondary | ICD-10-CM

## 2013-11-21 DIAGNOSIS — Z331 Pregnant state, incidental: Secondary | ICD-10-CM

## 2013-11-21 DIAGNOSIS — Z1389 Encounter for screening for other disorder: Secondary | ICD-10-CM

## 2013-11-21 DIAGNOSIS — Z8742 Personal history of other diseases of the female genital tract: Secondary | ICD-10-CM

## 2013-11-21 LAB — POCT URINALYSIS DIPSTICK
Blood, UA: NEGATIVE
Glucose, UA: NEGATIVE
Ketones, UA: NEGATIVE
LEUKOCYTES UA: NEGATIVE
NITRITE UA: NEGATIVE
PROTEIN UA: NEGATIVE

## 2013-11-21 MED ORDER — HYDROXYPROGESTERONE CAPROATE 250 MG/ML IM OIL
250.0000 mg | TOPICAL_OIL | Freq: Once | INTRAMUSCULAR | Status: AC
  Administered 2013-11-21: 250 mg via INTRAMUSCULAR

## 2013-11-27 ENCOUNTER — Inpatient Hospital Stay (HOSPITAL_COMMUNITY)
Admission: AD | Admit: 2013-11-27 | Discharge: 2013-12-13 | DRG: 765 | Discharge status: Against Medical Advice | Payer: Medicaid Other | Source: Ambulatory Visit | Attending: Obstetrics & Gynecology | Admitting: Obstetrics & Gynecology

## 2013-11-27 ENCOUNTER — Encounter (HOSPITAL_COMMUNITY): Payer: Self-pay | Admitting: *Deleted

## 2013-11-27 ENCOUNTER — Inpatient Hospital Stay (HOSPITAL_COMMUNITY): Payer: Medicaid Other

## 2013-11-27 DIAGNOSIS — O3430 Maternal care for cervical incompetence, unspecified trimester: Secondary | ICD-10-CM | POA: Diagnosis present

## 2013-11-27 DIAGNOSIS — O34219 Maternal care for unspecified type scar from previous cesarean delivery: Secondary | ICD-10-CM | POA: Diagnosis present

## 2013-11-27 DIAGNOSIS — O4290 Premature rupture of membranes, unspecified as to length of time between rupture and onset of labor, unspecified weeks of gestation: Principal | ICD-10-CM | POA: Diagnosis present

## 2013-11-27 DIAGNOSIS — O42919 Preterm premature rupture of membranes, unspecified as to length of time between rupture and onset of labor, unspecified trimester: Secondary | ICD-10-CM

## 2013-11-27 DIAGNOSIS — O99334 Smoking (tobacco) complicating childbirth: Secondary | ICD-10-CM | POA: Diagnosis present

## 2013-11-27 DIAGNOSIS — O321XX0 Maternal care for breech presentation, not applicable or unspecified: Secondary | ICD-10-CM | POA: Diagnosis present

## 2013-11-27 DIAGNOSIS — O26879 Cervical shortening, unspecified trimester: Secondary | ICD-10-CM | POA: Diagnosis present

## 2013-11-27 DIAGNOSIS — O9902 Anemia complicating childbirth: Secondary | ICD-10-CM | POA: Diagnosis present

## 2013-11-27 DIAGNOSIS — Z8742 Personal history of other diseases of the female genital tract: Secondary | ICD-10-CM

## 2013-11-27 DIAGNOSIS — Z98891 History of uterine scar from previous surgery: Secondary | ICD-10-CM

## 2013-11-27 DIAGNOSIS — F329 Major depressive disorder, single episode, unspecified: Secondary | ICD-10-CM | POA: Diagnosis present

## 2013-11-27 DIAGNOSIS — O99892 Other specified diseases and conditions complicating childbirth: Secondary | ICD-10-CM | POA: Diagnosis present

## 2013-11-27 DIAGNOSIS — D649 Anemia, unspecified: Secondary | ICD-10-CM | POA: Diagnosis present

## 2013-11-27 DIAGNOSIS — Z22330 Carrier of Group B streptococcus: Secondary | ICD-10-CM

## 2013-11-27 DIAGNOSIS — Z8751 Personal history of pre-term labor: Secondary | ICD-10-CM

## 2013-11-27 DIAGNOSIS — F1210 Cannabis abuse, uncomplicated: Secondary | ICD-10-CM | POA: Diagnosis present

## 2013-11-27 DIAGNOSIS — O99344 Other mental disorders complicating childbirth: Secondary | ICD-10-CM | POA: Diagnosis present

## 2013-11-27 DIAGNOSIS — Z8759 Personal history of other complications of pregnancy, childbirth and the puerperium: Secondary | ICD-10-CM

## 2013-11-27 DIAGNOSIS — O42913 Preterm premature rupture of membranes, unspecified as to length of time between rupture and onset of labor, third trimester: Secondary | ICD-10-CM | POA: Diagnosis present

## 2013-11-27 DIAGNOSIS — F3289 Other specified depressive episodes: Secondary | ICD-10-CM | POA: Diagnosis present

## 2013-11-27 DIAGNOSIS — O9989 Other specified diseases and conditions complicating pregnancy, childbirth and the puerperium: Secondary | ICD-10-CM

## 2013-11-27 LAB — URINALYSIS, ROUTINE W REFLEX MICROSCOPIC
BILIRUBIN URINE: NEGATIVE
Glucose, UA: NEGATIVE mg/dL
Hgb urine dipstick: NEGATIVE
Ketones, ur: NEGATIVE mg/dL
Leukocytes, UA: NEGATIVE
Nitrite: NEGATIVE
Protein, ur: NEGATIVE mg/dL
Specific Gravity, Urine: 1.01 (ref 1.005–1.030)
UROBILINOGEN UA: 0.2 mg/dL (ref 0.0–1.0)
pH: 8 (ref 5.0–8.0)

## 2013-11-27 LAB — CBC
HEMATOCRIT: 33 % — AB (ref 36.0–46.0)
Hemoglobin: 11.1 g/dL — ABNORMAL LOW (ref 12.0–15.0)
MCH: 26.4 pg (ref 26.0–34.0)
MCHC: 33.6 g/dL (ref 30.0–36.0)
MCV: 78.6 fL (ref 78.0–100.0)
PLATELETS: 259 10*3/uL (ref 150–400)
RBC: 4.2 MIL/uL (ref 3.87–5.11)
RDW: 12.7 % (ref 11.5–15.5)
WBC: 10.9 10*3/uL — ABNORMAL HIGH (ref 4.0–10.5)

## 2013-11-27 LAB — OB RESULTS CONSOLE GC/CHLAMYDIA
CHLAMYDIA, DNA PROBE: NEGATIVE
Gonorrhea: NEGATIVE

## 2013-11-27 LAB — RAPID URINE DRUG SCREEN, HOSP PERFORMED
Amphetamines: NOT DETECTED
BARBITURATES: NOT DETECTED
Benzodiazepines: NOT DETECTED
Cocaine: NOT DETECTED
Opiates: NOT DETECTED
TETRAHYDROCANNABINOL: POSITIVE — AB

## 2013-11-27 LAB — OB RESULTS CONSOLE GBS: STREP GROUP B AG: POSITIVE

## 2013-11-27 LAB — GROUP B STREP BY PCR: Group B strep by PCR: POSITIVE — AB

## 2013-11-27 MED ORDER — SODIUM CHLORIDE 0.9 % IV SOLN
250.0000 mg | Freq: Four times a day (QID) | INTRAVENOUS | Status: AC
  Administered 2013-11-27 – 2013-11-29 (×8): 250 mg via INTRAVENOUS
  Filled 2013-11-27 (×8): qty 5

## 2013-11-27 MED ORDER — DOCUSATE SODIUM 100 MG PO CAPS
100.0000 mg | ORAL_CAPSULE | Freq: Every day | ORAL | Status: DC
  Administered 2013-11-28 – 2013-12-06 (×6): 100 mg via ORAL
  Filled 2013-11-27 (×10): qty 1

## 2013-11-27 MED ORDER — MAGNESIUM SULFATE 40 G IN LACTATED RINGERS - SIMPLE
2.0000 g/h | INTRAVENOUS | Status: AC
  Filled 2013-11-27: qty 500

## 2013-11-27 MED ORDER — AMOXICILLIN 500 MG PO CAPS
500.0000 mg | ORAL_CAPSULE | Freq: Three times a day (TID) | ORAL | Status: AC
  Administered 2013-11-29 – 2013-12-04 (×15): 500 mg via ORAL
  Filled 2013-11-27 (×16): qty 1

## 2013-11-27 MED ORDER — ACETAMINOPHEN 325 MG PO TABS
650.0000 mg | ORAL_TABLET | ORAL | Status: DC | PRN
  Administered 2013-11-27 – 2013-12-07 (×4): 650 mg via ORAL
  Filled 2013-11-27 (×5): qty 2

## 2013-11-27 MED ORDER — HYDROXYPROGESTERONE CAPROATE 250 MG/ML IM OIL
250.0000 mg | TOPICAL_OIL | INTRAMUSCULAR | Status: DC
  Administered 2013-11-28 – 2013-12-05 (×2): 250 mg via INTRAMUSCULAR
  Filled 2013-11-27 (×3): qty 1

## 2013-11-27 MED ORDER — SODIUM CHLORIDE 0.9 % IV SOLN
2.0000 g | Freq: Four times a day (QID) | INTRAVENOUS | Status: AC
  Administered 2013-11-27 – 2013-11-29 (×8): 2 g via INTRAVENOUS
  Filled 2013-11-27 (×9): qty 2000

## 2013-11-27 MED ORDER — PRENATAL MULTIVITAMIN CH
1.0000 | ORAL_TABLET | Freq: Every day | ORAL | Status: DC
Start: 2013-11-27 — End: 2013-12-10
  Administered 2013-11-28 – 2013-12-09 (×12): 1 via ORAL
  Filled 2013-11-27 (×14): qty 1

## 2013-11-27 MED ORDER — SODIUM CHLORIDE 0.9 % IV SOLN: INTRAVENOUS | Status: DC

## 2013-11-27 MED ORDER — ZOLPIDEM TARTRATE 5 MG PO TABS
5.0000 mg | ORAL_TABLET | Freq: Every evening | ORAL | Status: DC | PRN
  Administered 2013-11-27 – 2013-12-10 (×9): 5 mg via ORAL
  Filled 2013-11-27 (×9): qty 1

## 2013-11-27 MED ORDER — BETAMETHASONE SOD PHOS & ACET 6 (3-3) MG/ML IJ SUSP
12.0000 mg | Freq: Once | INTRAMUSCULAR | Status: AC
  Administered 2013-11-28: 12 mg via INTRAMUSCULAR
  Filled 2013-11-27: qty 2

## 2013-11-27 MED ORDER — BETAMETHASONE SOD PHOS & ACET 6 (3-3) MG/ML IJ SUSP
12.0000 mg | Freq: Once | INTRAMUSCULAR | Status: AC
  Administered 2013-11-27: 12 mg via INTRAMUSCULAR
  Filled 2013-11-27: qty 2

## 2013-11-27 MED ORDER — LACTATED RINGERS IV SOLN
INTRAVENOUS | Status: DC
  Administered 2013-11-27 – 2013-11-29 (×4): via INTRAVENOUS

## 2013-11-27 MED ORDER — MAGNESIUM SULFATE BOLUS VIA INFUSION
4.0000 g | Freq: Once | INTRAVENOUS | Status: AC
  Administered 2013-11-27: 4 g via INTRAVENOUS
  Filled 2013-11-27: qty 500

## 2013-11-27 MED ORDER — ERYTHROMYCIN BASE 250 MG PO TABS
250.0000 mg | ORAL_TABLET | Freq: Four times a day (QID) | ORAL | Status: AC
  Administered 2013-11-29 – 2013-12-04 (×20): 250 mg via ORAL
  Filled 2013-11-27 (×20): qty 1

## 2013-11-27 MED ORDER — CALCIUM CARBONATE ANTACID 500 MG PO CHEW
2.0000 | CHEWABLE_TABLET | ORAL | Status: DC | PRN
Start: 2013-11-27 — End: 2013-12-10
  Administered 2013-11-30: 400 mg via ORAL
  Filled 2013-11-27: qty 1
  Filled 2013-11-27: qty 2

## 2013-11-27 NOTE — H&P (Addendum)
CSN: [id number]  Arrival date and time: 11/27/13 1151  First Provider Initiated Contact with Patient 11/27/13 1234  Chief Complaint   Patient presents with   .  Rupture of Membranes   HPI  [name] is a 28 y.o. female [phone] [id number] at 7275w2d who presents with questionable rupture of membranes. Pt first noticed the leaking of fluid last night around 2230; pt continued to leak clear fluid throughout the night. [facility] Pt has a significant history of preterm deliveries. Her last pregnancy she delivered at 24 weeks; ROM at 23 weeks. She currently has a cervical cerclage, receiving 17P weekly injections, progesterone and frequent cervical length imaging. Pt currently denies pain and does not complain of contractions.  OB History    Grav  Para  Term  Preterm  Abortions  TAB  SAB  Ect  Mult  Living    12  1   1  10   10    1       Past Medical History   Diagnosis  Date   .  Abnormal pap    .  Miscarriage  12/24/2012   .  Postpartum depression    .  Abnormal Pap smear    .  Vaginal bleeding in pregnancy  04/19/2013   .  History of preterm labor  08/01/2013   .  Pregnant  08/01/2013   .  History of cesarean section  08/01/2013   .  Abnormal Pap smear  08/08/2013     Pap CIN2/CIN3/CIS(HSIL) Needs colpo    Past Surgical History   Procedure  Laterality  Date   .  Cesarean section     .  Dilation and curettage of uterus   2011   .  Finger fracture surgery  Right      pinky as child   .  Cervical cerclage  N/A  10/04/2013     Procedure: CERCLAGE CERVICAL; Surgeon: Tilda BurrowJohn V Ferguson, MD; Location: [facility]; Service: Gynecology; Laterality: N/A;    Family History   Problem  Relation  Age of Onset   .  Diabetes  Maternal Grandmother    .  Hypertension  Mother    .  Thyroid disease  Mother    .  Diabetes  Maternal Uncle     History   Substance Use Topics   .  Smoking status:  Current Some Day Smoker -- 0.25 packs/day for 20 years     Types:   Cigarettes   .  Smokeless tobacco:  Never Used   .  Alcohol Use:  No   Allergies: No Known Allergies  Prescriptions prior to admission   Medication  Sig  Dispense  Refill   .  butalbital-acetaminophen-caffeine (FIORICET) 50-325-40 MG per tablet  Take 1-2 tablets by mouth every 8 (eight) hours as needed for headache.  30 tablet  1   .  ondansetron (ZOFRAN-ODT) 8 MG disintegrating tablet  Take 1 tablet (8 mg total) by mouth every 8 (eight) hours as needed for nausea.  20 tablet  2   .  prenatal vitamin w/FE, FA (PRENATAL 1 + 1) 27-1 MG TABS tablet  Take 1 tablet by mouth daily at 12 noon.  30 each  11   .  progesterone (PROMETRIUM) 200 MG capsule  Place 1 capsule in the vagina nightly  30 capsule  11    Results for orders placed during the hospital encounter of 11/27/13 (from the past 48 hour(s))  URINALYSIS, ROUTINE W REFLEX MICROSCOPIC Status: None    Collection Time    11/27/13 12:00 PM   Result  Value  Ref Range    Color, Urine  YELLOW  YELLOW    APPearance  CLEAR  CLEAR    Specific Gravity, Urine  1.010  1.005 - 1.030    pH  8.0  5.0 - 8.0    Glucose, UA  NEGATIVE  NEGATIVE mg/dL    Hgb urine dipstick  NEGATIVE  NEGATIVE    Bilirubin Urine  NEGATIVE  NEGATIVE    Ketones, ur  NEGATIVE  NEGATIVE mg/dL    Protein, ur  NEGATIVE  NEGATIVE mg/dL    Urobilinogen, UA  0.2  0.0 - 1.0 mg/dL    Nitrite  NEGATIVE  NEGATIVE    Leukocytes, UA  NEGATIVE  NEGATIVE    Comment:  MICROSCOPIC NOT DONE ON URINES WITH NEGATIVE PROTEIN, BLOOD, LEUKOCYTES, NITRITE, OR GLUCOSE <1000 mg/dL.   Review of Systems  Constitutional: Negative for fever and chills.  Gastrointestinal: Negative for nausea, vomiting, abdominal pain, diarrhea and constipation.  Genitourinary:  + leaking of fluid; clear, malodorous  Physical Exam   Blood pressure 100/50, pulse 103, temperature 98.3 F (36.8 C), temperature source Oral, resp. rate 18, height 5\' 5"  (1.651 m), weight 86.637 kg (191 lb), last menstrual period  03/15/2013.  Physical Exam  Constitutional: She is oriented to person, place, and time. She appears well-developed and well-nourished. No distress.  HENT:  Head: Normocephalic.  Eyes: Pupils are equal, round, and reactive to light.  Neck: Neck supple.  Respiratory: Effort normal.  GI: Soft. She exhibits no distension and no mass. There is no tenderness. There is no rebound and no guarding.  Genitourinary:  Speculum exam: Vagina - Moderate amount of clear fluid pooling in the vagina.  Cervix - No contact bleeding, no active bleeding, cervix appears closed, cerclage strings visualized  Bimanual exam: Deferred  Fern slide Chaperone present for exam.  Neurological: She is alert and oriented to person, place, and time.  Skin: Skin is warm. She is not diaphoretic.  Psychiatric: Her behavior is normal.  Fetal Tracing:  Baseline: 145 bpm  Variability: moderate  Accelerations: 10x10  Decelerations: None  Toco: No contractions    Prenatal Transfer Tool  Maternal Diabetes: No Genetic Screening: Normal Maternal Ultrasounds/Referrals: Normal (had previa but resolved) Fetal Ultrasounds or other Referrals:  None Maternal Substance Abuse:  No Significant Maternal Medications:  None Significant Maternal Lab Results: None  This PIT form was filled out on admission at 26 weeks.  NICU / Peds should follow hospital course notes to get a synopsis of further developments during this hospitalization.    MAU Course   Procedures  None  MDM  Speculum exam; fern slide collected  Positive fern slide; pt is grossly ruptured; clear fluid.  Dr. Penne Lash consulted. Dr Penne Lash will come to see the patient to discuss plan of care.  Betamethasone  OB limited US ordered   Yomar Mejorado--Pt seen adn examined.  See below assessment and plan. Assessment and Plan   A: 28 yo at 26 weeks 2 days with PPROM, cerclage in situ and history of classical cesarean section.  1.  PPROM--Routine orders for PPROM including  betamethasone, magnesium sulfate for neuroprophylaxis, amp/erythro to prolong the latent phase, nothing per vagina. 2.  [place] of fetus 3.  Prior classical c/s so will need rpt c/s and pt desires BTL.  Need to sign BTL papers 4.  UDS 5. Continyue delalutin 6.  Cerclage to be removed at time of c/s.

## 2013-11-27 NOTE — Consult Note (Signed)
Neonatology Consult to Antenatal Patient: 11/27/2013 10:50 PM    I was requested by Dr Penne LashLeggett to see this patient in order to provide antenatal counseling due to PPROM since 11/26/2013.    Megan Stevens is a 28 y/o G12P1 who was admitted today and is now 26 2/[redacted] weeks GA.   She is currently "not" having active labor.  She is getting BMZ, Magnesium Sulfate and IV antibiotics (Ampicillin and Erythromycin). EFW 735 grams (on 11/14/2013).  [name] has a significant history of preterm delivery at 24 weeks; PROM at 23 weeks and S/P perforation requiring ostomy last 2012. She currently has a cervical cerclage, receiving 17P weekly injections, progesterone and frequent cervical length imaging.  I spoke with Ms.Hambly and her friend in Room 152.   We discussed in detail what to expect in case of possible delivery of the infant in the next few days including morbidity and mortality at this gestational age, usual delivery room resuscitation including intubation and surfactant administration in the DR.  Discussed possible respiratory complications and need for support including mechanical ventilation, IV access, sepsis work-up, NG/OG feedings  (benefits of BF or formula but MOB not really interested with breast feeding at present time but was encouraged), risk for IVH with the potential for motor/cognitive deficits, length of stay and long-term outcome.  She had a few questions, which I answered.   I offered a NICU tour to any interested family members and would be glad to come back if she has more questions later.  Thank you for asking us to see this patient and allowing us to participate in her care.  Please call if there are any further questions.   Overton MamMary Ann T Baylyn Sickles, MD (Attending Neonatologist)  Total length of face-to-face or floor/unit time for this encounter was 25 minutes. Counseling and/or coordination of care was greater than fifty percent of the time above.

## 2013-11-27 NOTE — MAU Provider Note (Signed)
History     CSN: [id number]  Arrival date and time: 11/27/13 1151   First Provider Initiated Contact with Patient 11/27/13 1234      Chief Complaint  Patient presents with  . Rupture of Membranes   HPI  [name] is a 28 y.o. female [phone] at 44110w2d who presents with questionable rupture of membranes. Pt first noticed the leaking of fluid last night around 2230; pt continued to leak clear fluid throughout the night. [facility] Pt has a significant history of preterm deliveries. Her last pregnancy she delivered at 24 weeks; ROM at 23 weeks.  She currently has a cervical cerclage, receiving 17P weekly injections, progesterone and frequent cervical length imaging. Pt currently denies pain and does not complain of contractions.   OB History   Grav Para Term Preterm Abortions TAB SAB Ect Mult Living   12 1  1 10  10   1       Past Medical History  Diagnosis Date  . Abnormal pap   . Miscarriage 12/24/2012  . Postpartum depression   . Abnormal Pap smear   . Vaginal bleeding in pregnancy 04/19/2013  . History of preterm labor 08/01/2013  . Pregnant 08/01/2013  . History of cesarean section 08/01/2013  . Abnormal Pap smear 08/08/2013    Pap CIN2/CIN3/CIS(HSIL)  Needs colpo    Past Surgical History  Procedure Laterality Date  . Cesarean section    . Dilation and curettage of uterus  2011  . Finger fracture surgery Right     pinky as child  . Cervical cerclage N/A 10/04/2013    Procedure: CERCLAGE CERVICAL;  Surgeon: Megan BurrowJohn V Ferguson, MD;  Location: [facility];  Service: Gynecology;  Laterality: N/A;    Family History  Problem Relation Age of Onset  . Diabetes Maternal Grandmother   . Hypertension Mother   . Thyroid disease Mother   . Diabetes Maternal Uncle     History  Substance Use Topics  . Smoking status: Current Some Day Smoker -- 0.25 packs/day for 20 years    Types: Cigarettes  . Smokeless tobacco: Never Used  .  Alcohol Use: No    Allergies: No Known Allergies  Prescriptions prior to admission  Medication Sig Dispense Refill  . butalbital-acetaminophen-caffeine (FIORICET) 50-325-40 MG per tablet Take 1-2 tablets by mouth every 8 (eight) hours as needed for headache.  30 tablet  1  . ondansetron (ZOFRAN-ODT) 8 MG disintegrating tablet Take 1 tablet (8 mg total) by mouth every 8 (eight) hours as needed for nausea.  20 tablet  2  . prenatal vitamin w/FE, FA (PRENATAL 1 + 1) 27-1 MG TABS tablet Take 1 tablet by mouth daily at 12 noon.  30 each  11  . progesterone (PROMETRIUM) 200 MG capsule Place 1 capsule in the vagina nightly  30 capsule  11   Results for orders placed during the hospital encounter of 11/27/13 (from the past 48 hour(s))  URINALYSIS, ROUTINE W REFLEX MICROSCOPIC     Status: None   Collection Time    11/27/13 12:00 PM      Result Value Ref Range   Color, Urine YELLOW  YELLOW   APPearance CLEAR  CLEAR   Specific Gravity, Urine 1.010  1.005 - 1.030   pH 8.0  5.0 - 8.0   Glucose, UA NEGATIVE  NEGATIVE mg/dL   Hgb urine dipstick NEGATIVE  NEGATIVE   Bilirubin Urine NEGATIVE  NEGATIVE   Ketones, ur  NEGATIVE  NEGATIVE mg/dL   Protein, ur NEGATIVE  NEGATIVE mg/dL   Urobilinogen, UA 0.2  0.0 - 1.0 mg/dL   Nitrite NEGATIVE  NEGATIVE   Leukocytes, UA NEGATIVE  NEGATIVE   Comment: MICROSCOPIC NOT DONE ON URINES WITH NEGATIVE PROTEIN, BLOOD, LEUKOCYTES, NITRITE, OR GLUCOSE <1000 mg/dL.    Review of Systems  Constitutional: Negative for fever and chills.  Gastrointestinal: Negative for nausea, vomiting, abdominal pain, diarrhea and constipation.  Genitourinary:       + leaking of fluid; clear, malodorous    Physical Exam   Blood pressure 100/50, pulse 103, temperature 98.3 F (36.8 C), temperature source Oral, resp. rate 18, height 5\' 5"  (1.651 m), weight 86.637 kg (191 lb), last menstrual period 03/15/2013.  Physical Exam  Constitutional: She is oriented to person, place, and  time. She appears well-developed and well-nourished. No distress.  HENT:  Head: Normocephalic.  Eyes: Pupils are equal, round, and reactive to light.  Neck: Neck supple.  Respiratory: Effort normal.  GI: Soft. She exhibits no distension and no mass. There is no tenderness. There is no rebound and no guarding.  Genitourinary:  Speculum exam: Vagina - Moderate amount of clear fluid pooling in the vagina.  Cervix - No contact bleeding, no active bleeding, cervix appears closed, cerclage strings visualized  Bimanual exam: Deferred  Fern slide Chaperone present for exam.   Neurological: She is alert and oriented to person, place, and time.  Skin: Skin is warm. She is not diaphoretic.  Psychiatric: Her behavior is normal.     Fetal Tracing: Baseline: 145 bpm  Variability: moderate  Accelerations: 10x10 Decelerations: None Toco: No contractions    MAU Course  Procedures None  MDM Speculum exam; fern slide collected  Positive fern slide; pt is grossly ruptured; clear fluid.  Megan Stevens consulted. Dr Megan Stevens will come to see the patient to discuss plan of care.  Betamethasone OB limited US ordered   Assessment and Plan   A:  1. Preterm premature rupture of membranes    P: Admit per Megan Stevens Betamethasone, first dose in MAU    Megan Hansen Megan Gewirtz, NP  11/27/2013, 12:56 PM

## 2013-11-27 NOTE — Progress Notes (Signed)
Received to the room 152

## 2013-11-27 NOTE — Plan of Care (Signed)
Problem: Consults Goal: Birthing Suites Patient Information Press F2 to bring up selections list   Pt < [redacted] weeks EGA     

## 2013-11-27 NOTE — MAU Provider Note (Signed)
Pt seen and examined. See my addendum. Attestation of Attending Supervision of Advanced Practitioner (CNM/NP): Evaluation and management procedures were performed by the Advanced Practitioner under my supervision and collaboration. I have reviewed the Advanced Practitioner's note and chart, and I agree with the management and plan.  Orlando Thalmann H. 4:31 PM

## 2013-11-27 NOTE — MAU Note (Signed)
Patient presents with complaint of possible rupture of membranes at 2215 last night. Has a cerclage in place. With previous pregnancy, patient's water broke at 20 weeks and she delivered at 24 weeks.

## 2013-11-28 ENCOUNTER — Other Ambulatory Visit: Payer: Medicaid Other

## 2013-11-28 ENCOUNTER — Encounter (HOSPITAL_COMMUNITY): Payer: Self-pay | Admitting: Women's Health

## 2013-11-28 ENCOUNTER — Encounter: Payer: Medicaid Other | Admitting: Obstetrics and Gynecology

## 2013-11-28 DIAGNOSIS — O4290 Premature rupture of membranes, unspecified as to length of time between rupture and onset of labor, unspecified weeks of gestation: Secondary | ICD-10-CM

## 2013-11-28 LAB — TYPE AND SCREEN
ABO/RH(D): B POS
Antibody Screen: NEGATIVE

## 2013-11-28 LAB — ABO/RH: ABO/RH(D): B POS

## 2013-11-28 LAB — GC/CHLAMYDIA PROBE AMP
CT PROBE, AMP APTIMA: NEGATIVE
GC Probe RNA: NEGATIVE

## 2013-11-28 NOTE — Progress Notes (Signed)
Ur chart review completed.  

## 2013-11-28 NOTE — Progress Notes (Signed)
Patient ID: Tawanna Sat, female   DOB: Jan 16, 1986, 28 y.o.   MRN: [id number] FACULTY PRACTICE ANTEPARTUM(COMPREHENSIVE) NOTE  AMANDO ISHIKAWA is a 28 y.o. [id number] at [redacted]w[redacted]d  who is admitted for PPROM.   Length of Stay:  1  Days  Subjective: Patient reports the fetal movement as active. Patient reports uterine contraction  activity as none. Patient reports  vaginal bleeding as none. Patient describes fluid per vagina as Clear.  Vitals:  Blood pressure 107/68, pulse 101, temperature 98.2 F (36.8 C), temperature source Oral, resp. rate 16, height 5\' 5"  (1.651 m), weight 191 lb (86.637 kg), last menstrual period 03/15/2013, SpO2 98.00%. Physical Examination:  General appearance - alert, well appearing, and in no distress Abdomen - soft, non tender, gravid uterus Extremities - peripheral pulses normal, no pedal edema, no clubbing or cyanosis, Homan's sign negative bilaterally  Fetal Monitoring:  Baseline: 135 bpm, Variability: Good {> 6 bpm), Accelerations: Reactive and Decelerations: Absent  Labs:  Results for orders placed during the hospital encounter of 11/27/13 (from the past 24 hour(s))  URINALYSIS, ROUTINE W REFLEX MICROSCOPIC   Collection Time    11/27/13 12:00 PM      Result Value Ref Range   Color, Urine YELLOW  YELLOW   APPearance CLEAR  CLEAR   Specific Gravity, Urine 1.010  1.005 - 1.030   pH 8.0  5.0 - 8.0   Glucose, UA NEGATIVE  NEGATIVE mg/dL   Hgb urine dipstick NEGATIVE  NEGATIVE   Bilirubin Urine NEGATIVE  NEGATIVE   Ketones, ur NEGATIVE  NEGATIVE mg/dL   Protein, ur NEGATIVE  NEGATIVE mg/dL   Urobilinogen, UA 0.2  0.0 - 1.0 mg/dL   Nitrite NEGATIVE  NEGATIVE   Leukocytes, UA NEGATIVE  NEGATIVE  CBC   Collection Time    11/27/13  1:20 PM      Result Value Ref Range   WBC 10.9 (*) 4.0 - 10.5 K/uL   RBC 4.20  3.87 - 5.11 MIL/uL   Hemoglobin 11.1 (*) 12.0 - 15.0 g/dL   HCT 78.2 (*) 95.6 - 21.3 %   MCV 78.6  78.0 - 100.0 fL   MCH 26.4  26.0 - 34.0 pg   MCHC 33.6  30.0 - 36.0 g/dL   RDW 08.6  57.8 - 46.9 %   Platelets 259  150 - 400 K/uL  GROUP B STREP BY PCR   Collection Time    11/27/13  8:10 PM      Result Value Ref Range   Group B strep by PCR POSITIVE (*) NEGATIVE  URINE RAPID DRUG SCREEN (HOSP PERFORMED)   Collection Time    11/27/13  9:00 PM      Result Value Ref Range   Opiates NONE DETECTED  NONE DETECTED   Cocaine NONE DETECTED  NONE DETECTED   Benzodiazepines NONE DETECTED  NONE DETECTED   Amphetamines NONE DETECTED  NONE DETECTED   Tetrahydrocannabinol POSITIVE (*) NONE DETECTED   Barbiturates NONE DETECTED  NONE DETECTED  TYPE AND SCREEN   Collection Time    11/28/13  5:15 AM      Result Value Ref Range   ABO/RH(D) B POS     Antibody Screen PENDING     Sample Expiration 12/01/2013      Medications:  Scheduled . ampicillin (OMNIPEN) IV  2 g Intravenous Q6H   Followed by  . [START ON 11/29/2013] amoxicillin  500 mg Oral Q8H  . betamethasone acetate-betamethasone sodium phosphate  12 mg Intramuscular Once  .  docusate sodium  100 mg Oral Daily  . erythromycin  250 mg Intravenous Q6H   Followed by  . [START ON 11/29/2013] erythromycin  250 mg Oral Q6H  . hydroxyprogesterone caproate  250 mg Intramuscular Weekly  . prenatal multivitamin  1 tablet Oral Q1200   I have reviewed the patient's current medications. Prior to Admission:  Prescriptions prior to admission  Medication Sig Dispense Refill  . HYDROXYPROGESTERONE CAPROATE IM Inject 1 each into the muscle once a week.      . ondansetron (ZOFRAN-ODT) 8 MG disintegrating tablet Take 1 tablet (8 mg total) by mouth every 8 (eight) hours as needed for nausea.  20 tablet  2  . prenatal vitamin w/FE, FA (PRENATAL 1 + 1) 27-1 MG TABS tablet Take 1 tablet by mouth daily at 12 noon.  30 each  11  . progesterone (PROMETRIUM) 200 MG capsule Place 1 capsule in the vagina nightly  30 capsule  11  . butalbital-acetaminophen-caffeine (FIORICET) 50-325-40 MG per tablet Take 1-2  tablets by mouth every 8 (eight) hours as needed for headache.  30 tablet  1    ASSESSMENT: Patient Active Problem List   Diagnosis Date Noted  . Premature rupture of membranes 11/27/2013  . Short cervix affecting pregnancy 10/31/2013  . Inadequate social support 09/27/2013  . Pregnancy complicated by noncompliance in first trimester, antepartum 09/23/2013  . Supervision of high risk pregnancy in first trimester 08/22/2013  . H/O cervical incompetence 08/22/2013  . Abnormal Pap smear 08/08/2013  . History of preterm labor 08/01/2013  . History of cesarean section 08/01/2013  . Postpartum depression 03/17/2013    PLAN: Tawanna SatJulia A Petrovic is a 28 y.o. G12 P0,11,01 at 7979w3d  who is admitted for PPROM.    1-Pt is s/p magnesium.  2nd dose of betamethasone is due today.  17-P is due today as well.   2-Pt needs BTL papers signed today. 3-NICU consults done 4-Social support needed (FOB not involved, pt is now in same sex relationship). 5-UDS positive for THC. 6-Continuous toco and NST bid  Joleen Stuckert H. 11/28/2013,6:04 AM

## 2013-11-29 DIAGNOSIS — O4290 Premature rupture of membranes, unspecified as to length of time between rupture and onset of labor, unspecified weeks of gestation: Secondary | ICD-10-CM

## 2013-11-29 NOTE — Progress Notes (Signed)
Patient ID: Megan Stevens, female   DOB: 06/09/1986, 28 y.o.   MRN: [id number] FACULTY PRACTICE ANTEPARTUM(COMPREHENSIVE) NOTE  Megan Stevens is a 28 y.o. [id number] at 6269w4d  who is admitted for PPROM.   Length of Stay:  2  Days  Subjective: Patient reports the fetal movement as active. Patient reports uterine contraction  activity as none. Patient reports  vaginal bleeding as none. Patient describes fluid per vagina as Clear.  Vitals:  Blood pressure 110/64, pulse 92, temperature 98.1 F (36.7 C), temperature source Oral, resp. rate 18, height 5\' 5"  (1.651 m), weight 191 lb (86.637 kg), last menstrual period 03/15/2013, SpO2 98.00%. Physical Examination:  General appearance - alert, well appearing, and in no distress Abdomen - soft, non tender, gravid uterus Extremities - peripheral pulses normal, no pedal edema, no clubbing or cyanosis, Homan's sign negative bilaterally  Fetal Monitoring:  Baseline: 150 bpm, Variability: Good {> 6 bpm), Accelerations: Non-reactive but appropriate for gestational age, Decelerations: Absent and TOCO: no contractions  Labs:  No results found for this or any previous visit (from the past 24 hour(s)).  Medications:  Scheduled . ampicillin (OMNIPEN) IV  2 g Intravenous Q6H   Followed by  . amoxicillin  500 mg Oral Q8H  . docusate sodium  100 mg Oral Daily  . erythromycin  250 mg Intravenous Q6H   Followed by  . erythromycin  250 mg Oral Q6H  . hydroxyprogesterone caproate  250 mg Intramuscular Weekly  . prenatal multivitamin  1 tablet Oral Q1200   I have reviewed the patient's current medications. Prior to Admission:  Prescriptions prior to admission  Medication Sig Dispense Refill  . HYDROXYPROGESTERONE CAPROATE IM Inject 1 each into the muscle once a week.      . ondansetron (ZOFRAN-ODT) 8 MG disintegrating tablet Take 1 tablet (8 mg total) by mouth every 8 (eight) hours as needed for nausea.  20 tablet  2  . prenatal vitamin w/FE, FA  (PRENATAL 1 + 1) 27-1 MG TABS tablet Take 1 tablet by mouth daily at 12 noon.  30 each  11  . progesterone (PROMETRIUM) 200 MG capsule Place 1 capsule in the vagina nightly  30 capsule  11  . butalbital-acetaminophen-caffeine (FIORICET) 50-325-40 MG per tablet Take 1-2 tablets by mouth every 8 (eight) hours as needed for headache.  30 tablet  1    ASSESSMENT: Patient Active Problem List   Diagnosis Date Noted  . Premature rupture of membranes 11/27/2013  . Short cervix affecting pregnancy 10/31/2013  . Inadequate social support 09/27/2013  . Pregnancy complicated by noncompliance in first trimester, antepartum 09/23/2013  . Supervision of high risk pregnancy in first trimester 08/22/2013  . H/O cervical incompetence 08/22/2013  . Abnormal Pap smear 08/08/2013  . History of preterm labor 08/01/2013  . History of cesarean section 08/01/2013  . Postpartum depression 03/17/2013    PLAN: Megan Stevens is a 28 y.o. G12 P0,11,01 at 3569w4d  who is admitted for PPROM, s/p MgSO4 and BMZ  Continue latency antibiotics Continue monitoring for si/sx of chorioamnionitis Continue current antepartum care Patient remains ambivalent regarding BTL. Will have her sign BTL papers today in order to keep that as an option for her.  Dorita Rowlands 11/29/2013,6:55 AM

## 2013-11-30 ENCOUNTER — Telehealth: Payer: Self-pay | Admitting: Women's Health

## 2013-11-30 MED ORDER — SODIUM CHLORIDE 0.9 % IJ SOLN
3.0000 mL | Freq: Two times a day (BID) | INTRAMUSCULAR | Status: DC
  Administered 2013-11-30 (×2): 3 mL via INTRAVENOUS

## 2013-11-30 NOTE — Progress Notes (Signed)
Patient ID: Megan Stevens, female   DOB: 11/24/1985, 28 y.o.   MRN: [id number] FACULTY PRACTICE ANTEPARTUM(COMPREHENSIVE) NOTE  Megan SatJulia A Cheever is a 28 y.o. [id number] at 4034w5d by best clinical estimate who is admitted for PROM.   Fetal presentation is cephalic. Length of Stay:  3  Days  Subjective: Doing well, no complaints Patient reports the fetal movement as active. Patient reports uterine contraction  activity as none. Patient reports  vaginal bleeding as none. Patient describes fluid per vagina as Clear.  Vitals:  Blood pressure 128/86, pulse 104, temperature 98.1 F (36.7 C), temperature source Oral, resp. rate 18, height 5\' 5"  (1.651 m), weight 191 lb (86.637 kg), last menstrual period 03/15/2013, SpO2 98.00%. Physical Examination:  General appearance - alert, well appearing, and in no distress Abdomen - soft, nontender, nondistended, no masses or organomegaly Gravid, non-tender Fundal Height:  size equals dates Extremities: extremities normal, atraumatic, no cyanosis or edema  Membranes:ruptured, clear fluid  Fetal Monitoring:  Baseline: 140 bpm, Variability: Good {> 6 bpm), Accelerations: Non-reactive but appropriate for gestational age and Decelerations: Absent  Labs:  No results found for this or any previous visit (from the past 24 hour(s)).    Medications:  Scheduled . amoxicillin  500 mg Oral Q8H  . docusate sodium  100 mg Oral Daily  . erythromycin  250 mg Oral Q6H  . hydroxyprogesterone caproate  250 mg Intramuscular Weekly  . prenatal multivitamin  1 tablet Oral Q1200  . sodium chloride  3 mL Intravenous Q12H   I have reviewed the patient's current medications.  ASSESSMENT: Patient Active Problem List   Diagnosis Date Noted  . Premature rupture of membranes 11/27/2013  . Short cervix affecting pregnancy 10/31/2013  . Inadequate social support 09/27/2013  . Pregnancy complicated by noncompliance in first trimester, antepartum 09/23/2013  . Supervision  of high risk pregnancy in first trimester 08/22/2013  . H/O cervical incompetence 08/22/2013  . Abnormal Pap smear 08/08/2013  . History of preterm labor 08/01/2013  . History of cesarean section 08/01/2013  . Postpartum depression 03/17/2013    PLAN: Continue inpt. Monitoring. Delivery with s/sx's of chorio. Po Abx.  Neva Ramaswamy S 11/30/2013,7:00 AM

## 2013-11-30 NOTE — Telephone Encounter (Signed)
Spoke with pt letting her know the nurses in the hospital can provide her with a note for her lawyer per Selena BattenKim, CNM. [name]

## 2013-12-01 ENCOUNTER — Encounter: Payer: Self-pay | Admitting: Obstetrics and Gynecology

## 2013-12-01 LAB — TYPE AND SCREEN
ABO/RH(D): B POS
Antibody Screen: NEGATIVE

## 2013-12-01 NOTE — Progress Notes (Signed)
Patient ID: Megan Stevens, female   DOB: 09/06/1986, 28 y.o.   MRN: [id number] FACULTY PRACTICE ANTEPARTUM(COMPREHENSIVE) NOTE  Megan SatJulia A Stevens is a 28 y.o. [id number] at 2247w6d by early ultrasound who is admitted for PROM, with cerclage in place, and hx of Classical Cesarean last pregnancy for 24wk breech that did well..   Fetal presentation is unsure. Pt will be a cesarean delivery Length of Stay:  4  Days  Subjective: Pt is more active in room than she should be.  Importance of bedrest emphasized x2 rounds Patient reports the fetal movement as active. Patient reports uterine contraction  activity as none. Patient reports  vaginal bleeding as none. Patient describes fluid per vagina as Other having a tiny bit of brown d/c .  Vitals:  Blood pressure 118/43, pulse 87, temperature 98.3 F (36.8 C), temperature source Oral, resp. rate 18, height 5\' 5"  (1.651 m), weight 87 kg (191 lb 12.8 oz), last menstrual period 03/15/2013, SpO2 98.00%. Physical Examination:  General appearance - alert, well appearing, and in no distress, oriented to person, place, and time, playful, active and reminded again of need to stay Bedrest with BRP Heart - normal rate and regular rhythm Abdomen - soft, nontender, nondistended Fundal Height:  size equals dates Cervical Exam: Not evaluated.  Extremities: extremities normal, atraumatic, no cyanosis or edema and Homans sign is negative, no sign of DVT with DTRs 2+ bilaterally Membranes:intact, ruptured  Fetal Monitoring:  To be started now  Labs:  No results found for this or any previous visit (from the past 24 hour(s)).  Imaging Studies:     Currently EPIC will not allow sonographic studies to automatically populate into notes.  In the meantime, copy and paste results into note or free text.  Medications:  Scheduled . amoxicillin  500 mg Oral Q8H  . docusate sodium  100 mg Oral Daily  . erythromycin  250 mg Oral Q6H  . hydroxyprogesterone caproate  250 mg  Intramuscular Weekly  . prenatal multivitamin  1 tablet Oral Q1200  . sodium chloride  3 mL Intravenous Q12H   I have reviewed the patient's current medications.  ASSESSMENT: Patient Active Problem List   Diagnosis Date Noted  . Premature rupture of membranes 11/27/2013  . Short cervix affecting pregnancy 10/31/2013  . Inadequate social support 09/27/2013  . Pregnancy complicated by noncompliance in first trimester, antepartum 09/23/2013  . Supervision of high risk pregnancy in first trimester 08/22/2013  . H/O cervical incompetence 08/22/2013  . Abnormal Pap smear 08/08/2013  . History of preterm labor 08/01/2013  . History of cesarean section 08/01/2013  . Postpartum depression 03/17/2013    PLAN: Inpatient status til delivery Will need repeat cesarean due to classical prior c/s Will give note for court date( hx accessory to shoplifting)  Victormanuel Mclure V 12/01/2013,7:24 AM

## 2013-12-01 NOTE — Progress Notes (Signed)
Pt. Refused to use a wheelchair for her wheelchair privilege.  She walked out of her room and down the hall after I advised her not to. I advised the FOB that she needs to use the wheelchair because she is ruptured. He left with her as well. Will continue to monitor.

## 2013-12-01 NOTE — Progress Notes (Signed)
UR completed 

## 2013-12-02 NOTE — Progress Notes (Signed)
Patient ID: Megan Stevens, female   DOB: 10/19/1985, 28 y.o.   MRN: [id number] FACULTY PRACTICE ANTEPARTUM(COMPREHENSIVE) NOTE  Megan Stevens is a 28 y.o. [id number] at 3624w0d by early ultrasound who is admitted for PROM, with cerclage in place, and hx of Classical Cesarean last pregnancy for 24wk breech that did well..    Fetal presentation is unsure. Pt will be a cesarean delivery Length of Stay:  5  Days  Subjective: Pt has been adhering better to bedrest. Feels well this AM Patient reports the fetal movement as active. Patient reports uterine contraction  activity as none. Patient reports  vaginal bleeding as none. Patient describes fluid per vagina as Other having a tiny bit of brown d/c .  Vitals:  Blood pressure 130/67, pulse 93, temperature 98.2 F (36.8 C), temperature source Oral, resp. rate 18, height 5\' 5"  (1.651 m), weight 87 kg (191 lb 12.8 oz), last menstrual period 03/15/2013, SpO2 98.00%. Physical Examination:  General appearance - alert, well appearing, and in no distress, oriented to person, place, and time,  Heart - normal rate and regular rhythm Abdomen - soft, nontender, nondistended Fundal Height:  size equals dates Cervical Exam: Not evaluated.  Extremities: extremities normal, atraumatic, no cyanosis or edema and Homans sign is negative, no sign of DVT with DTRs 2+ bilaterally Membranes:intact, ruptured  Fetal Monitoring:  150, mod variability, +10x10  Labs:  No results found for this or any previous visit (from the past 24 hour(s)).  Imaging Studies:    none  Medications:  Scheduled . amoxicillin  500 mg Oral Q8H  . docusate sodium  100 mg Oral Daily  . erythromycin  250 mg Oral Q6H  . hydroxyprogesterone caproate  250 mg Intramuscular Weekly  . prenatal multivitamin  1 tablet Oral Q1200   I have reviewed the patient's current medications.  ASSESSMENT: Patient Active Problem List   Diagnosis Date Noted  . Premature rupture of membranes  11/27/2013  . Short cervix affecting pregnancy 10/31/2013  . Inadequate social support 09/27/2013  . Pregnancy complicated by noncompliance in first trimester, antepartum 09/23/2013  . Supervision of high risk pregnancy in first trimester 08/22/2013  . H/O cervical incompetence 08/22/2013  . Abnormal Pap smear 08/08/2013  . History of preterm labor 08/01/2013  . History of cesarean section 08/01/2013  . Postpartum depression 03/17/2013    PLAN: Inpatient status til delivery Will need repeat cesarean due to classical prior c/s Will give note for court date( hx accessory to shoplifting) Amp erythro to complete on 3/1 S/p BMZ and CP Mag  Tymara Saur L 12/02/2013,9:07 AM

## 2013-12-03 NOTE — Progress Notes (Signed)
FACULTY PRACTICE ANTEPARTUM(COMPREHENSIVE) NOTE  Megan Stevens is a 89Taw[age] Stevens y.o. [id number] at 8025w1d by early ultrasound who is admitted for rupture of membranes.  , cerclage, and prior classical cesarean for a 24 week infant Fetal presentation is cephalic. Length of Stay:  6  Days  Subjective: No complaints, no contractions, is staying in bed much more Patient reports the fetal movement as active. Patient reports uterine contraction  activity as none. Patient reports  vaginal bleeding as none., but Patient describes fluid per vagina as Other still lite brown.  Vitals:  Blood pressure 122/77, pulse 91, temperature 98.5 F (36.9 C), temperature source Oral, resp. rate 18, height 5\' 5"  (1.651 m), weight 191 lb 12.8 oz (87 kg), last menstrual period 03/15/2013, SpO2 98.00%. Physical Examination:  General appearance - alert, well appearing, and in no distress Heart - normal rate and regular rhythm Abdomen - soft, nontender, nondistended Fundal Height:  size equals dates Cervical Exam: Not evaluated. A Extremities: extremities normal, atraumatic, no cyanosis or edema and Homans sign is negative, no sign of DVT with DTRs 2+ bilaterally Membranes:intact, ruptured  Fetal Monitoring:  twice daily, not on monitor yet this a.m, previously reassuring  Labs:  No results found for this or any previous visit (from the past 24 hour(s)).  Imaging Studies:     Currently EPIC will not allow sonographic studies to automatically populate into notes.  In the meantime, copy and paste results into note or free text.  Medications:  Scheduled . amoxicillin  500 mg Oral Q8H  . docusate sodium  100 mg Oral Daily  . erythromycin  250 mg Oral Q6H  . hydroxyprogesterone caproate  250 mg Intramuscular Weekly  . prenatal multivitamin  1 tablet Oral Q1200   I have reviewed the patient's current medications.  ASSESSMENT: Patient Active Problem List   Diagnosis Date Noted  . Premature rupture of membranes  11/27/2013  . Short cervix affecting pregnancy 10/31/2013  . Inadequate social support 09/27/2013  . Pregnancy complicated by noncompliance in first trimester, antepartum 09/23/2013  . Supervision of high risk pregnancy in first trimester 08/22/2013  . H/O cervical incompetence 08/22/2013  . Abnormal Pap smear 08/08/2013  . History of preterm labor 08/01/2013  . History of cesarean section 08/01/2013  . Postpartum depression 03/17/2013    PLAN: Inpatient care til delivery REpeat cesarean with tubal ligation at 34 weeks, or when develops labor, infection or change in status. Pt has signed papers for btl.  Megan Stevens 12/03/2013,7:53 AM

## 2013-12-04 LAB — TYPE AND SCREEN
ABO/RH(D): B POS
Antibody Screen: NEGATIVE

## 2013-12-04 NOTE — Progress Notes (Signed)
Patient ID: Megan Stevens, female   DOB: 08/29/1986, 28 y.o.   MRN: [id number] FACULTY PRACTICE ANTEPARTUM(COMPREHENSIVE) NOTE  Megan SatJulia A Folk is a 28 y.o. [id number] at 2546w2d by early ultrasound who is admitted for rupture of membranes. , cerclage, and prior classical cesarean for a 24 week infant  Fetal presentation is cephalic. [name].   Length of Stay:  7  Days  Subjective: Pt is without complaints, and is staying in bed more Patient reports the fetal movement as active. Patient reports uterine contraction  activity as none. Patient reports  vaginal bleeding as none. Patient describes fluid per vagina as Other lite brown 2 pads/day.  Vitals:  Blood pressure 102/73, pulse 93, temperature 98.5 F (36.9 C), temperature source Oral, resp. rate 18, height 5\' 5"  (1.651 m), weight 87 kg (191 lb 12.8 oz), last menstrual period 03/15/2013, SpO2 98.00%. Physical Examination:  General appearance - alert, well appearing, and in no distress and well hydrated Heart - normal rate and regular rhythm Abdomen - soft, nontender, nondistended Fundal Height:  size equals dates Cervical Exam: Not evaluated.  Extremities: extremities normal, atraumatic, no cyanosis or edema and Homans sign is negative, no sign of DVT with DTRs 2+ bilaterally Membranes:intact, ruptured  Fetal Monitoring:  Baseline: 145 bpm and no contractions on yesterdays eval. being monitored 2x/day  Labs:  Results for orders placed during the hospital encounter of 11/27/13 (from the past 24 hour(s))  TYPE AND SCREEN   Collection Time    12/04/13  5:30 AM      Result Value Ref Range   ABO/RH(D) B POS     Antibody Screen NEG     Sample Expiration 12/07/2013      Imaging Studies:     Currently EPIC will not allow sonographic studies to automatically populate into notes.  In the meantime, copy and paste results into note or free text.  Medications:  Scheduled . docusate sodium  100 mg Oral Daily  . erythromycin  250 mg Oral Q6H   . hydroxyprogesterone caproate  250 mg Intramuscular Weekly  . prenatal multivitamin  1 tablet Oral Q1200   I have reviewed the patient's current medications.  ASSESSMENT: Patient Active Problem List   Diagnosis Date Noted  . Premature rupture of membranes 11/27/2013  . Short cervix affecting pregnancy 10/31/2013  . Inadequate social support 09/27/2013  . Pregnancy complicated by noncompliance in first trimester, antepartum 09/23/2013  . Supervision of high risk pregnancy in first trimester 08/22/2013  . H/O cervical incompetence 08/22/2013  . Abnormal Pap smear 08/08/2013  . History of preterm labor 08/01/2013  . History of cesarean section 08/01/2013  . Postpartum depression 03/17/2013    PLAN: Inpatient care til delivery  REpeat cesarean with tubal ligation at 34 weeks, or when develops labor, infection or change in status. Pt has signed papers for btl.      Valicia Rief V 12/04/2013,7:37 AM

## 2013-12-05 ENCOUNTER — Inpatient Hospital Stay (HOSPITAL_COMMUNITY): Payer: Medicaid Other

## 2013-12-05 DIAGNOSIS — O26879 Cervical shortening, unspecified trimester: Secondary | ICD-10-CM

## 2013-12-05 NOTE — Progress Notes (Signed)
Patient ID: Megan SatJulia A Luckow, female   DOB: 04/23/1986, 28 y.o.   MRN: [id number] FACULTY PRACTICE ANTEPARTUM(COMPREHENSIVE) NOTE  Megan Stevens is a 28 y.o. [id number] at 881w3d by early ultrasound who is admitted for rupture of membranes.  Has cerclage in place and history of prior classical cesarean for a 24 week infant . Fetal presentation is cephalic.   Length of Stay:  8  Days  Subjective: Pt is without complaints, and is staying in bed more Patient reports the fetal movement as active. Patient reports uterine contraction  activity as none. Patient reports  vaginal bleeding as none. Patient describes fluid per vagina as Other lite brown 2 pads/day.  Vitals:  Blood pressure 131/75, pulse 102, temperature 97.4 F (36.3 C), temperature source Oral, resp. rate 20, height 5\' 5"  (1.651 m), weight 191 lb 12.8 oz (87 kg), last menstrual period 03/15/2013, SpO2 98.00%. Physical Examination: General appearance - alert, well appearing, and in no distress and well hydrated Heart - normal rate and regular rhythm Abdomen - soft, nontender, nondistended Fundal Height:  size equals dates Cervical Exam: Not evaluated.  Extremities: extremities normal, atraumatic, no cyanosis or edema and Homans sign is negative, no sign of DVT with DTRs 2+ bilaterally Membranes:intact, ruptured  Fetal Monitoring:  Baseline: 150 bpm, Variability: moderate, Accelerations: Non-reactive but appropriate for gestational age and Decelerations: rare small variable decelerations  Labs:  No results found for this or any previous visit (from the past 24 hour(s)).  Imaging Studies:    11/27/13  SDP 6 cm, subjectively low normal fluid, cephalic, anterior placenta, marginal cord insertion, cervix 2.7 cm in length  Medications:  Scheduled . docusate sodium  100 mg Oral Daily  . hydroxyprogesterone caproate  250 mg Intramuscular Weekly  . prenatal multivitamin  1 tablet Oral Q1200   I have reviewed the patient's current  medications.  ASSESSMENT: Patient Active Problem List   Diagnosis Date Noted  . Preterm premature rupture of membranes in third trimester 11/27/2013  . Short cervix affecting pregnancy 10/31/2013  . Inadequate social support 09/27/2013  . Pregnancy complicated by noncompliance in first trimester, antepartum 09/23/2013  . Supervision of high risk pregnancy in first trimester 08/22/2013  . H/O cervical incompetence 08/22/2013  . Abnormal Pap smear 08/08/2013  . History of preterm labor 08/01/2013  . History of cesarean section 08/01/2013  . 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 Pt has signed papers for BTS. Patient understands that BTS may not be performed in the event of a non-reassuring fetal status at the time of birth. Will order formal follow up scan/weekly AFI today Routine antenatal care    Tereso NewcomerUgonna A Shalandria Elsbernd, MD 12/05/2013,7:22 AM

## 2013-12-05 NOTE — Progress Notes (Signed)
Patient's SO called out for patient; upon entering room patient states she would like to be put on the monitor because she feels like she is cramping; Toco applied; patient refusing medication for discomfort at this time

## 2013-12-05 NOTE — Progress Notes (Signed)
Antenatal Nutrition Assessment:  Currently  27 3/[redacted] weeks gestation, with rupture of membranes at 26 weeks. Height  65 "  Weight 191 lbs  pre-pregnancy weight 170 lbs .  Pre-pregnancy  BMI 27.8  IBW 125 lbs Total weight gain 21 lbs Weight gain goals 15-25 lbs Estimated needs: 2000 kcal/day, 71-77 grams protein/day, 2+ liters fluid/day  Regular diet tolerated well, appetite good. Current diet prescription will provide for increased needs.  No abnormal nutrition related labs  Nutrition Dx: Increased nutrient needs r/t pregnancy and fetal growth requirements aeb 27 3/[redacted] weeks gestation.  No educational needs assessed at this time.  Joaquin CourtsKimberly Harris, RD, LDN, CNSC Pager [phone] After Hours Pager [phone]

## 2013-12-05 NOTE — Progress Notes (Signed)
Ur chart review completed.  

## 2013-12-06 NOTE — Progress Notes (Signed)
Patient ID: Megan Stevens, female   DOB: 01/26/1986, 28 y.o.   MRN: [id number] ACULTY PRACTICE ANTEPARTUM COMPREHENSIVE PROGRESS NOTE  Megan SatJulia A Markov is a 28 y.o. [id number] at 7447w4d  who is admitted for active labor, PROM, cerclage in place.   Fetal presentation is cephalic. Length of Stay:  9  Days  Subjective: Pt denies complaints.  Reports less LOF. Patient reports good fetal movement.  She reports rare uterine contractions,  And no bleeding.   Vitals:  Blood pressure 115/66, pulse 94, temperature 97.6 F (36.4 C), temperature source Oral, resp. rate 18, height 5\' 5"  (1.651 m), weight 191 lb 12.8 oz (87 kg), last menstrual period 05/27/2013, SpO2 98.00%. Physical Examination: General appearance - alert, well appearing, and in no distress Abdomen - soft, nontender, nondistended, no masses or organomegaly graivd Extremities - no pedal edema noted Cervical Exam: Not evaluated.  Membranes:intact  Fetal Monitoring:  Baseline: 150's bpm, Variability: Good {> 6 bpm), Accelerations: Reactive and toco without ctx  Labs:  No results found for this or any previous visit (from the past 24 hour(s)).  Imaging Studies:    sono yesterday AFI 5.8cm; appropriate growth   Medications:  Scheduled . docusate sodium  100 mg Oral Daily  . hydroxyprogesterone caproate  250 mg Intramuscular Weekly  . prenatal multivitamin  1 tablet Oral Q1200   I have reviewed the patient's current medications.  ASSESSMENT: Patient Active Problem List   Diagnosis Date Noted  . Preterm premature rupture of membranes in third trimester 11/27/2013  . Short cervix affecting pregnancy 10/31/2013  . Inadequate social support 09/27/2013  . Pregnancy complicated by noncompliance in first trimester, antepartum 09/23/2013  . Supervision of high risk pregnancy in first trimester 08/22/2013  . H/O cervical incompetence 08/22/2013  . Abnormal Pap smear 08/08/2013  . History of preterm labor 08/01/2013  . History of  cesarean section 08/01/2013  . 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   HARRAWAY-SMITH, Pedro Whiters 12/06/2013,8:09 AM

## 2013-12-07 LAB — TYPE AND SCREEN
ABO/RH(D): B POS
Antibody Screen: NEGATIVE

## 2013-12-07 NOTE — Progress Notes (Signed)
Patient ID: Megan Stevens, female   DOB: 01/12/1986, 28 y.o.   MRN: [id number] FACULTY PRACTICE ANTEPARTUM COMPREHENSIVE PROGRESS NOTE  Megan Stevens is a 28 y.o. [id number] at 7552w5d  who is admitted for active labor, PPROM, cerclage in place.   Fetal presentation is cephalic. Length of Stay:  10  Days  Subjective: Pt denies complaints.  Reports less LOF. Patient reports good fetal movement.  She reports rare uterine contractions,  And no bleeding.   Vitals:  Blood pressure 129/72, pulse 99, temperature 98.2 F (36.8 C), temperature source Oral, resp. rate 18, height 5\' 5"  (1.651 m), weight 191 lb 12.8 oz (87 kg), last menstrual period 05/27/2013, SpO2 98.00%. Physical Examination: General appearance - alert, well appearing, and in no distress Abdomen - soft, nontender,graivd Extremities - no pedal edema noted Cervical Exam: Not evaluated.  Membranes:ruptured  Fetal Monitoring:  Baseline: 150 bpm, Variability: Good {> 6 bpm), Accelerations: Non-reactive but appropriate for gestational age, Decelerations: variable decel x 1 overnight and toco without ctx  Labs:  No results found for this or any previous visit (from the past 24 hour(s)).  Imaging Studies:    3/2 Ultrasound AFI 5.8cm; appropriate growth   Medications:  Scheduled . docusate sodium  100 mg Oral Daily  . hydroxyprogesterone caproate  250 mg Intramuscular Weekly  . prenatal multivitamin  1 tablet Oral Q1200   I have reviewed the patient's current medications.  ASSESSMENT: Patient Active Problem List   Diagnosis Date Noted  . Preterm premature rupture of membranes in third trimester 11/27/2013  . Short cervix affecting pregnancy 10/31/2013  . Inadequate social support 09/27/2013  . Pregnancy complicated by noncompliance in first trimester, antepartum 09/23/2013  . Supervision of high risk pregnancy in first trimester 08/22/2013  . H/O cervical incompetence 08/22/2013  . Abnormal Pap smear 08/08/2013  . History  of preterm labor 08/01/2013  . History of cesarean section 08/01/2013  . 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   Torris House 12/07/2013,6:53 AM

## 2013-12-08 NOTE — Progress Notes (Signed)
UR completed 

## 2013-12-08 NOTE — Progress Notes (Signed)
Patient ID: Megan SatJulia A Lavey, female   DOB: 08/22/1986, 28 y.o.   MRN: [id number] FACULTY PRACTICE ANTEPARTUM(COMPREHENSIVE) NOTE  Megan Stevens is a 28 y.o. [id number] at 225w6d by early ultrasound who is admitted for rupture of membranes.   Fetal presentation is cephalic. Length of Stay:  11  Days  Subjective: Pt denies contractions. She DOES occasionally notice "gas cramps", but is aware that we wish to monitor her promptly if any cramps develop. Patient reports the fetal movement as active. Patient reports uterine contraction  activity as none. Patient reports  vaginal bleeding as none. Patient describes fluid per vagina as Clear.  Vitals:  Blood pressure 135/79, pulse 97, temperature 97.7 F (36.5 C), temperature source Oral, resp. rate 18, height 5\' 5"  (1.651 m), weight 194 lb 12.8 oz (88.361 kg), last menstrual period 05/27/2013, SpO2 98.00%. Physical Examination:  General appearance - alert, well appearing, and in no distress Heart - normal rate and regular rhythm Abdomen - soft, nontender, nondistended Fundal Height:  size equals dates Cervical Exam: Not evaluated. and  Extremities: extremities normal, atraumatic, no cyanosis or edema and Homans sign is negative, no sign of DVT with DTRs 2+ bilaterally Membranes:intact, ruptured  Fetal Monitoring:  Baseline: 145 bpm, Variability: Fair (1-6 bpm) and Accelerations: Non-reactive but appropriate for gestational age  Labs:  No results found for this or any previous visit (from the past 24 hour(s)).  Imaging Studies:     Currently EPIC will not allow sonographic studies to automatically populate into notes.  In the meantime, copy and paste results into note or free text.  Medications:  Scheduled . docusate sodium  100 mg Oral Daily  . hydroxyprogesterone caproate  250 mg Intramuscular Weekly  . prenatal multivitamin  1 tablet Oral Q1200   I have reviewed the patient's current medications.  ASSESSMENT: Patient Active Problem  List   Diagnosis Date Noted  . Preterm premature rupture of membranes in third trimester 11/27/2013  . Short cervix affecting pregnancy 10/31/2013  . Inadequate social support 09/27/2013  . Pregnancy complicated by noncompliance in first trimester, antepartum 09/23/2013  . Supervision of high risk pregnancy in first trimester 08/22/2013  . H/O cervical incompetence 08/22/2013  . Abnormal Pap smear 08/08/2013  . History of preterm labor 08/01/2013  . History of cesarean section 08/01/2013  . 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

## 2013-12-09 LAB — CBC
HCT: 31.9 % — ABNORMAL LOW (ref 36.0–46.0)
HEMOGLOBIN: 10.6 g/dL — AB (ref 12.0–15.0)
MCH: 26.4 pg (ref 26.0–34.0)
MCHC: 33.2 g/dL (ref 30.0–36.0)
MCV: 79.4 fL (ref 78.0–100.0)
Platelets: 237 10*3/uL (ref 150–400)
RBC: 4.02 MIL/uL (ref 3.87–5.11)
RDW: 13.5 % (ref 11.5–15.5)
WBC: 12.7 10*3/uL — ABNORMAL HIGH (ref 4.0–10.5)

## 2013-12-09 LAB — GLUCOSE TOLERANCE, 1 HOUR: Glucose, 1 Hour GTT: 114 mg/dL (ref 70–140)

## 2013-12-09 NOTE — Progress Notes (Signed)
Patient ID: Megan Stevens, female   DOB: 12/15/1985, 28 y.o.   MRN: [id number] FACULTY PRACTICE ANTEPARTUM(COMPREHENSIVE) NOTE  Megan Stevens is a 28 y.o. [id number] at [redacted]w[redacted]d  who is admitted for PPROM.   Length of Stay:  12  Days  Subjective: Patient reports the fetal movement as active. Patient reports uterine contraction  activity as cramping on right side yesterday. Patient reports  vaginal bleeding as none. Patient describes fluid per vagina as Clear.  Vitals:  Blood pressure 129/52, pulse 99, temperature 98 F (36.7 C), temperature source Oral, resp. rate 18, height 5\' 5"  (1.651 m), weight 194 lb 12.8 oz (88.361 kg), last menstrual period 05/27/2013, SpO2 98.00%. Physical Examination:  General appearance - alert, well appearing, and in no distress Abdomen - soft, non tender, gravid Extremities - no edema, redness or tenderness in the calves or thighs  Fetal Monitoring:  Baseline: 145 bpm, Variability: Good {> 6 bpm), Accelerations: Reactive and Decelerations: Absent  Labs:  No results found for this or any previous visit (from the past 24 hour(s)).  Medications:  Scheduled . docusate sodium  100 mg Oral Daily  . hydroxyprogesterone caproate  250 mg Intramuscular Weekly  . prenatal multivitamin  1 tablet Oral Q1200   I have reviewed the patient's current medications.  ASSESSMENT: Patient Active Problem List   Diagnosis Date Noted  . Preterm premature rupture of membranes in third trimester 11/27/2013  . Short cervix affecting pregnancy 10/31/2013  . Inadequate social support 09/27/2013  . Pregnancy complicated by noncompliance in first trimester, antepartum 09/23/2013  . Supervision of high risk pregnancy in first trimester 08/22/2013  . H/O cervical incompetence 08/22/2013  . Abnormal Pap smear 08/08/2013  . History of preterm labor 08/01/2013  . History of cesarean section 08/01/2013  . 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 Continue bedrest Delivery planned at 34 weeks or sooner if clinically indicated.  Vandy Tsuchiya H. 12/09/2013,7:41 AM

## 2013-12-10 ENCOUNTER — Encounter (HOSPITAL_COMMUNITY): Payer: Self-pay | Admitting: Anesthesiology

## 2013-12-10 ENCOUNTER — Encounter (HOSPITAL_COMMUNITY)
Admission: AD | Discharge status: Against Medical Advice | Payer: Self-pay | Source: Ambulatory Visit | Attending: Obstetrics & Gynecology

## 2013-12-10 ENCOUNTER — Inpatient Hospital Stay (HOSPITAL_COMMUNITY): Payer: Medicaid Other | Admitting: Anesthesiology

## 2013-12-10 ENCOUNTER — Encounter (HOSPITAL_COMMUNITY): Payer: Medicaid Other | Admitting: Anesthesiology

## 2013-12-10 DIAGNOSIS — O4290 Premature rupture of membranes, unspecified as to length of time between rupture and onset of labor, unspecified weeks of gestation: Secondary | ICD-10-CM

## 2013-12-10 DIAGNOSIS — O3430 Maternal care for cervical incompetence, unspecified trimester: Secondary | ICD-10-CM

## 2013-12-10 DIAGNOSIS — O26879 Cervical shortening, unspecified trimester: Secondary | ICD-10-CM

## 2013-12-10 LAB — CBC
HCT: 33.6 % — ABNORMAL LOW (ref 36.0–46.0)
Hemoglobin: 11.3 g/dL — ABNORMAL LOW (ref 12.0–15.0)
MCH: 26.8 pg (ref 26.0–34.0)
MCHC: 33.6 g/dL (ref 30.0–36.0)
MCV: 79.6 fL (ref 78.0–100.0)
PLATELETS: 264 10*3/uL (ref 150–400)
RBC: 4.22 MIL/uL (ref 3.87–5.11)
RDW: 13.9 % (ref 11.5–15.5)
WBC: 15.4 10*3/uL — AB (ref 4.0–10.5)

## 2013-12-10 LAB — TYPE AND SCREEN
ABO/RH(D): B POS
Antibody Screen: NEGATIVE

## 2013-12-10 SURGERY — Surgical Case
Anesthesia: Spinal | Site: Abdomen

## 2013-12-10 MED ORDER — MORPHINE SULFATE 0.5 MG/ML IJ SOLN
INTRAMUSCULAR | Status: AC
  Filled 2013-12-10: qty 10

## 2013-12-10 MED ORDER — SCOPOLAMINE 1 MG/3DAYS TD PT72: 1.0000 | MEDICATED_PATCH | Freq: Once | TRANSDERMAL | Status: DC

## 2013-12-10 MED ORDER — FENTANYL CITRATE 0.05 MG/ML IJ SOLN
INTRAMUSCULAR | Status: DC | PRN
  Administered 2013-12-10: 15 ug via INTRAVENOUS

## 2013-12-10 MED ORDER — SODIUM CHLORIDE 0.9 % IJ SOLN: 3.0000 mL | INTRAMUSCULAR | Status: DC | PRN

## 2013-12-10 MED ORDER — SIMETHICONE 80 MG PO CHEW
80.0000 mg | CHEWABLE_TABLET | ORAL | Status: DC | PRN
  Administered 2013-12-10 (×2): 80 mg via ORAL
  Filled 2013-12-10 (×2): qty 1

## 2013-12-10 MED ORDER — PHENYLEPHRINE 40 MCG/ML (10ML) SYRINGE FOR IV PUSH (FOR BLOOD PRESSURE SUPPORT)
PREFILLED_SYRINGE | INTRAVENOUS | Status: AC
  Filled 2013-12-10: qty 5

## 2013-12-10 MED ORDER — METOCLOPRAMIDE HCL 5 MG/ML IJ SOLN: 10.0000 mg | Freq: Once | INTRAMUSCULAR | Status: DC | PRN

## 2013-12-10 MED ORDER — SENNOSIDES-DOCUSATE SODIUM 8.6-50 MG PO TABS
2.0000 | ORAL_TABLET | ORAL | Status: DC
  Administered 2013-12-10 – 2013-12-12 (×2): 2 via ORAL
  Filled 2013-12-10 (×3): qty 2

## 2013-12-10 MED ORDER — MORPHINE SULFATE (PF) 0.5 MG/ML IJ SOLN
INTRAMUSCULAR | Status: DC | PRN
  Administered 2013-12-10: .1 mg via EPIDURAL

## 2013-12-10 MED ORDER — DIPHENHYDRAMINE HCL 50 MG/ML IJ SOLN
12.5000 mg | INTRAMUSCULAR | Status: DC | PRN
  Administered 2013-12-10 (×2): 12.5 mg via INTRAVENOUS
  Filled 2013-12-10: qty 1

## 2013-12-10 MED ORDER — SIMETHICONE 80 MG PO CHEW: 80.0000 mg | CHEWABLE_TABLET | ORAL | Status: DC

## 2013-12-10 MED ORDER — METOCLOPRAMIDE HCL 5 MG/ML IJ SOLN: 10.0000 mg | Freq: Three times a day (TID) | INTRAMUSCULAR | Status: DC | PRN

## 2013-12-10 MED ORDER — OXYTOCIN 10 UNIT/ML IJ SOLN
40.0000 [IU] | INTRAVENOUS | Status: DC | PRN
  Administered 2013-12-10: 40 [IU] via INTRAVENOUS

## 2013-12-10 MED ORDER — ONDANSETRON HCL 4 MG PO TABS: 4.0000 mg | ORAL_TABLET | ORAL | Status: DC | PRN

## 2013-12-10 MED ORDER — BUTALBITAL-APAP-CAFFEINE 50-325-40 MG PO TABS: 1.0000 | ORAL_TABLET | Freq: Three times a day (TID) | ORAL | Status: DC | PRN

## 2013-12-10 MED ORDER — SCOPOLAMINE 1 MG/3DAYS TD PT72
MEDICATED_PATCH | TRANSDERMAL | Status: AC
  Filled 2013-12-10: qty 1

## 2013-12-10 MED ORDER — BUPIVACAINE HCL (PF) 0.5 % IJ SOLN
INTRAMUSCULAR | Status: AC
  Filled 2013-12-10: qty 30

## 2013-12-10 MED ORDER — DIBUCAINE 1 % RE OINT: 1.0000 "application " | TOPICAL_OINTMENT | RECTAL | Status: DC | PRN

## 2013-12-10 MED ORDER — FENTANYL CITRATE 0.05 MG/ML IJ SOLN: 25.0000 ug | INTRAMUSCULAR | Status: DC | PRN

## 2013-12-10 MED ORDER — BUPIVACAINE HCL (PF) 0.5 % IJ SOLN
INTRAMUSCULAR | Status: DC | PRN
  Administered 2013-12-10: 30 mL

## 2013-12-10 MED ORDER — OXYCODONE-ACETAMINOPHEN 5-325 MG PO TABS
2.0000 | ORAL_TABLET | Freq: Once | ORAL | Status: AC
  Administered 2013-12-10: 2 via ORAL
  Filled 2013-12-10: qty 2

## 2013-12-10 MED ORDER — NALOXONE HCL 1 MG/ML IJ SOLN: 1.0000 ug/kg/h | INTRAVENOUS | Status: DC | PRN

## 2013-12-10 MED ORDER — KETOROLAC TROMETHAMINE 60 MG/2ML IM SOLN
60.0000 mg | Freq: Once | INTRAMUSCULAR | Status: AC | PRN
  Administered 2013-12-10: 60 mg via INTRAMUSCULAR

## 2013-12-10 MED ORDER — PROMETHAZINE HCL 25 MG/ML IJ SOLN: 6.2500 mg | INTRAMUSCULAR | Status: DC | PRN

## 2013-12-10 MED ORDER — TETANUS-DIPHTH-ACELL PERTUSSIS 5-2.5-18.5 LF-MCG/0.5 IM SUSP
0.5000 mL | Freq: Once | INTRAMUSCULAR | Status: AC
  Administered 2013-12-11: 0.5 mL via INTRAMUSCULAR
  Filled 2013-12-10: qty 0.5

## 2013-12-10 MED ORDER — DIPHENHYDRAMINE HCL 50 MG/ML IJ SOLN
12.5000 mg | INTRAMUSCULAR | Status: DC | PRN
Start: 2013-12-10 — End: 2013-12-10

## 2013-12-10 MED ORDER — NIFEDIPINE 10 MG PO CAPS
30.0000 mg | ORAL_CAPSULE | Freq: Once | ORAL | Status: AC
  Administered 2013-12-10: 30 mg via ORAL
  Filled 2013-12-10 (×2): qty 3

## 2013-12-10 MED ORDER — FENTANYL CITRATE 0.05 MG/ML IJ SOLN
INTRAMUSCULAR | Status: AC
  Filled 2013-12-10: qty 2

## 2013-12-10 MED ORDER — SCOPOLAMINE 1 MG/3DAYS TD PT72
1.0000 | MEDICATED_PATCH | Freq: Once | TRANSDERMAL | Status: AC
  Administered 2013-12-10: 1.5 mg via TRANSDERMAL

## 2013-12-10 MED ORDER — KETOROLAC TROMETHAMINE 30 MG/ML IJ SOLN: 30.0000 mg | Freq: Four times a day (QID) | INTRAMUSCULAR | Status: AC | PRN

## 2013-12-10 MED ORDER — NALBUPHINE HCL 10 MG/ML IJ SOLN: 5.0000 mg | INTRAMUSCULAR | Status: DC | PRN

## 2013-12-10 MED ORDER — NIFEDIPINE ER 30 MG PO TB24
30.0000 mg | ORAL_TABLET | Freq: Once | ORAL | Status: DC
  Filled 2013-12-10: qty 1

## 2013-12-10 MED ORDER — ONDANSETRON HCL 4 MG/2ML IJ SOLN: 4.0000 mg | INTRAMUSCULAR | Status: DC | PRN

## 2013-12-10 MED ORDER — MEPERIDINE HCL 25 MG/ML IJ SOLN: 6.2500 mg | INTRAMUSCULAR | Status: DC | PRN

## 2013-12-10 MED ORDER — DIPHENHYDRAMINE HCL 50 MG/ML IJ SOLN
INTRAMUSCULAR | Status: AC
  Administered 2013-12-10: 12.5 mg via INTRAVENOUS
  Filled 2013-12-10: qty 1

## 2013-12-10 MED ORDER — TERBUTALINE SULFATE 1 MG/ML IJ SOLN
INTRAMUSCULAR | Status: AC
  Administered 2013-12-10: 0.25 mg via SUBCUTANEOUS
  Filled 2013-12-10: qty 1

## 2013-12-10 MED ORDER — SIMETHICONE 80 MG PO CHEW: 80.0000 mg | CHEWABLE_TABLET | Freq: Three times a day (TID) | ORAL | Status: DC

## 2013-12-10 MED ORDER — DIPHENHYDRAMINE HCL 25 MG PO CAPS
25.0000 mg | ORAL_CAPSULE | Freq: Four times a day (QID) | ORAL | Status: DC | PRN
  Administered 2013-12-10 – 2013-12-11 (×2): 25 mg via ORAL
  Filled 2013-12-10 (×2): qty 1

## 2013-12-10 MED ORDER — DIPHENHYDRAMINE HCL 25 MG PO CAPS: 25.0000 mg | ORAL_CAPSULE | ORAL | Status: DC | PRN

## 2013-12-10 MED ORDER — PHENYLEPHRINE HCL 10 MG/ML IJ SOLN
INTRAMUSCULAR | Status: DC | PRN
  Administered 2013-12-10 (×2): 80 ug via INTRAVENOUS
  Administered 2013-12-10 (×2): 40 ug via INTRAVENOUS
  Administered 2013-12-10 (×3): 80 ug via INTRAVENOUS

## 2013-12-10 MED ORDER — DEXTROSE 5 % IV SOLN
1.0000 ug/kg/h | INTRAVENOUS | Status: DC | PRN
  Filled 2013-12-10: qty 2

## 2013-12-10 MED ORDER — ZOLPIDEM TARTRATE 5 MG PO TABS: 5.0000 mg | ORAL_TABLET | Freq: Every evening | ORAL | Status: DC | PRN

## 2013-12-10 MED ORDER — KETOROLAC TROMETHAMINE 30 MG/ML IJ SOLN: 30.0000 mg | Freq: Four times a day (QID) | INTRAMUSCULAR | Status: DC | PRN

## 2013-12-10 MED ORDER — BUPIVACAINE IN DEXTROSE 0.75-8.25 % IT SOLN
INTRATHECAL | Status: DC | PRN
  Administered 2013-12-10: 1.7 mL via INTRATHECAL

## 2013-12-10 MED ORDER — OXYCODONE-ACETAMINOPHEN 5-325 MG PO TABS
1.0000 | ORAL_TABLET | ORAL | Status: DC | PRN
  Administered 2013-12-10 – 2013-12-12 (×5): 2 via ORAL
  Administered 2013-12-12: 1 via ORAL
  Administered 2013-12-12 – 2013-12-13 (×3): 2 via ORAL
  Administered 2013-12-13: 1 via ORAL
  Filled 2013-12-10: qty 2
  Filled 2013-12-10: qty 1
  Filled 2013-12-10 (×6): qty 2
  Filled 2013-12-10: qty 1
  Filled 2013-12-10: qty 2

## 2013-12-10 MED ORDER — ONDANSETRON HCL 4 MG/2ML IJ SOLN: 4.0000 mg | Freq: Three times a day (TID) | INTRAMUSCULAR | Status: DC | PRN

## 2013-12-10 MED ORDER — ONDANSETRON HCL 4 MG/2ML IJ SOLN
INTRAMUSCULAR | Status: AC
  Filled 2013-12-10: qty 2

## 2013-12-10 MED ORDER — TERBUTALINE SULFATE 1 MG/ML IJ SOLN
0.2500 mg | Freq: Once | INTRAMUSCULAR | Status: AC
  Administered 2013-12-10: 0.25 mg via SUBCUTANEOUS

## 2013-12-10 MED ORDER — HYDROMORPHONE HCL PF 1 MG/ML IJ SOLN: 0.2500 mg | INTRAMUSCULAR | Status: DC | PRN

## 2013-12-10 MED ORDER — LACTATED RINGERS IV SOLN
INTRAVENOUS | Status: DC
  Administered 2013-12-10: 15:00:00 via INTRAVENOUS

## 2013-12-10 MED ORDER — LANOLIN HYDROUS EX OINT: 1.0000 "application " | TOPICAL_OINTMENT | CUTANEOUS | Status: DC | PRN

## 2013-12-10 MED ORDER — CITRIC ACID-SODIUM CITRATE 334-500 MG/5ML PO SOLN
ORAL | Status: AC
Start: 2013-12-10 — End: 2013-12-10
  Administered 2013-12-10: 30 mL
  Filled 2013-12-10: qty 15

## 2013-12-10 MED ORDER — KETOROLAC TROMETHAMINE 60 MG/2ML IM SOLN
INTRAMUSCULAR | Status: AC
  Administered 2013-12-10: 60 mg via INTRAMUSCULAR
  Filled 2013-12-10: qty 2

## 2013-12-10 MED ORDER — NALOXONE HCL 0.4 MG/ML IJ SOLN: 0.4000 mg | INTRAMUSCULAR | Status: DC | PRN

## 2013-12-10 MED ORDER — DEXTROSE 5 % IV SOLN
2.0000 g | INTRAVENOUS | Status: AC
  Administered 2013-12-10: 2 g via INTRAVENOUS
  Filled 2013-12-10: qty 2

## 2013-12-10 MED ORDER — PRENATAL MULTIVITAMIN CH: 1.0000 | ORAL_TABLET | Freq: Every day | ORAL | Status: DC

## 2013-12-10 MED ORDER — WITCH HAZEL-GLYCERIN EX PADS: 1.0000 "application " | MEDICATED_PAD | CUTANEOUS | Status: DC | PRN

## 2013-12-10 MED ORDER — DIPHENHYDRAMINE HCL 50 MG/ML IJ SOLN: 25.0000 mg | INTRAMUSCULAR | Status: DC | PRN

## 2013-12-10 MED ORDER — PHENYLEPHRINE 40 MCG/ML (10ML) SYRINGE FOR IV PUSH (FOR BLOOD PRESSURE SUPPORT)
PREFILLED_SYRINGE | INTRAVENOUS | Status: AC
  Filled 2013-12-10: qty 10

## 2013-12-10 MED ORDER — NALOXONE HCL 0.4 MG/ML IJ SOLN
0.4000 mg | INTRAMUSCULAR | Status: DC | PRN
Start: 2013-12-10 — End: 2013-12-13

## 2013-12-10 MED ORDER — DIPHENHYDRAMINE HCL 25 MG PO CAPS
25.0000 mg | ORAL_CAPSULE | ORAL | Status: DC | PRN
  Filled 2013-12-10 (×2): qty 1

## 2013-12-10 MED ORDER — ONDANSETRON HCL 4 MG/2ML IJ SOLN
INTRAMUSCULAR | Status: DC | PRN
Start: 2013-12-10 — End: 2013-12-10
  Administered 2013-12-10: 4 mg via INTRAVENOUS

## 2013-12-10 MED ORDER — MENTHOL 3 MG MT LOZG: 1.0000 | LOZENGE | OROMUCOSAL | Status: DC | PRN

## 2013-12-10 MED ORDER — MEASLES, MUMPS & RUBELLA VAC ~~LOC~~ INJ
0.5000 mL | INJECTION | Freq: Once | SUBCUTANEOUS | Status: DC
  Filled 2013-12-10: qty 0.5

## 2013-12-10 MED ORDER — OXYTOCIN 40 UNITS IN LACTATED RINGERS INFUSION - SIMPLE MED: 62.5000 mL/h | INTRAVENOUS | Status: AC

## 2013-12-10 MED ORDER — PRENATAL PLUS 27-1 MG PO TABS
1.0000 | ORAL_TABLET | Freq: Every day | ORAL | Status: DC
  Administered 2013-12-10 – 2013-12-12 (×3): 1 via ORAL
  Filled 2013-12-10 (×3): qty 1

## 2013-12-10 MED ORDER — LACTATED RINGERS IV SOLN
INTRAVENOUS | Status: DC | PRN
  Administered 2013-12-10 (×3): via INTRAVENOUS

## 2013-12-10 MED ORDER — OXYTOCIN 10 UNIT/ML IJ SOLN
INTRAMUSCULAR | Status: AC
  Filled 2013-12-10: qty 4

## 2013-12-10 MED ORDER — IBUPROFEN 600 MG PO TABS
600.0000 mg | ORAL_TABLET | Freq: Four times a day (QID) | ORAL | Status: DC
Start: 2013-12-10 — End: 2013-12-13
  Administered 2013-12-10 – 2013-12-13 (×9): 600 mg via ORAL
  Filled 2013-12-10 (×10): qty 1

## 2013-12-10 MED ORDER — DIPHENHYDRAMINE HCL 50 MG/ML IJ SOLN
25.0000 mg | INTRAMUSCULAR | Status: DC | PRN
Start: 2013-12-10 — End: 2013-12-10

## 2013-12-10 SURGICAL SUPPLY — 45 items
APL SKNCLS STERI-STRIP NONHPOA (GAUZE/BANDAGES/DRESSINGS) ×1
BENZOIN TINCTURE PRP APPL 2/3 (GAUZE/BANDAGES/DRESSINGS) ×3 IMPLANT
BINDER ABD UNIV 10 28-50 (GAUZE/BANDAGES/DRESSINGS) ×1 IMPLANT
BINDER ABD UNIV 12 45-62 (WOUND CARE) IMPLANT
BINDER ABDOM UNIV 10 (GAUZE/BANDAGES/DRESSINGS) ×3
BINDER ABDOMINAL 46IN 62IN (WOUND CARE)
CLAMP CORD UMBIL (MISCELLANEOUS) IMPLANT
CLOSURE WOUND 1/2 X4 (GAUZE/BANDAGES/DRESSINGS) ×1
CLOTH BEACON ORANGE TIMEOUT ST (SAFETY) ×3 IMPLANT
DRAPE LG THREE QUARTER DISP (DRAPES) IMPLANT
DRSG OPSITE POSTOP 4X10 (GAUZE/BANDAGES/DRESSINGS) ×3 IMPLANT
DURAPREP 26ML APPLICATOR (WOUND CARE) ×3 IMPLANT
ELECT REM PT RETURN 9FT ADLT (ELECTROSURGICAL) ×3
ELECTRODE REM PT RTRN 9FT ADLT (ELECTROSURGICAL) ×1 IMPLANT
EXTRACTOR VACUUM KIWI (MISCELLANEOUS) IMPLANT
GLOVE BIO SURGEON STRL SZ7 (GLOVE) ×3 IMPLANT
GLOVE BIOGEL PI IND STRL 7.0 (GLOVE) ×1 IMPLANT
GLOVE BIOGEL PI INDICATOR 7.0 (GLOVE) ×8
GOWN STRL REUS W/ TWL XL LVL3 (GOWN DISPOSABLE) ×1 IMPLANT
GOWN STRL REUS W/TWL LRG LVL3 (GOWN DISPOSABLE) ×3 IMPLANT
GOWN STRL REUS W/TWL XL LVL3 (GOWN DISPOSABLE) ×3
KIT ABG SYR 3ML LUER SLIP (SYRINGE) ×2 IMPLANT
NDL HYPO 25X5/8 SAFETYGLIDE (NEEDLE) IMPLANT
NEEDLE HYPO 22GX1.5 SAFETY (NEEDLE) ×3 IMPLANT
NEEDLE HYPO 25X5/8 SAFETYGLIDE (NEEDLE) ×3 IMPLANT
NS IRRIG 1000ML POUR BTL (IV SOLUTION) ×3 IMPLANT
PACK C SECTION WH (CUSTOM PROCEDURE TRAY) ×3 IMPLANT
PAD ABD 7.5X8 STRL (GAUZE/BANDAGES/DRESSINGS) ×2 IMPLANT
PAD OB MATERNITY 4.3X12.25 (PERSONAL CARE ITEMS) ×3 IMPLANT
RTRCTR C-SECT PINK 25CM LRG (MISCELLANEOUS) ×2 IMPLANT
SPONGE SURGIFOAM ABS GEL 12-7 (HEMOSTASIS) IMPLANT
STAPLER VISISTAT 35W (STAPLE) IMPLANT
STRIP CLOSURE SKIN 1/2X4 (GAUZE/BANDAGES/DRESSINGS) ×2 IMPLANT
SUT PDS AB 0 CTX 60 (SUTURE) IMPLANT
SUT PLAIN 0 NONE (SUTURE) IMPLANT
SUT SILK 0 TIES 10X30 (SUTURE) IMPLANT
SUT VIC AB 0 CT1 36 (SUTURE) ×9 IMPLANT
SUT VIC AB 3-0 CT1 27 (SUTURE) ×3
SUT VIC AB 3-0 CT1 TAPERPNT 27 (SUTURE) ×1 IMPLANT
SUT VIC AB 4-0 KS 27 (SUTURE) ×2 IMPLANT
SYR CONTROL 10ML LL (SYRINGE) ×3 IMPLANT
TAPE HYPAFIX 4 X10 (GAUZE/BANDAGES/DRESSINGS) ×2 IMPLANT
TOWEL OR 17X24 6PK STRL BLUE (TOWEL DISPOSABLE) ×3 IMPLANT
TRAY FOLEY CATH 14FR (SET/KITS/TRAYS/PACK) ×3 IMPLANT
WATER STERILE IRR 1000ML POUR (IV SOLUTION) ×1 IMPLANT

## 2013-12-10 NOTE — Addendum Note (Signed)
Addendum created 12/10/13 1554 by Renford DillsJanet L Vedanth Sirico, CRNA   Modules edited: Notes Section   Notes Section:  File: [id number]

## 2013-12-10 NOTE — Anesthesia Procedure Notes (Signed)
Spinal  Patient location during procedure: OR Start time: 12/10/2013 7:18 AM End time: 12/10/2013 7:21 AM Staffing Anesthesiologist: Nolon Nations R Performed by: anesthesiologist  Preanesthetic Checklist Completed: patient identified, site marked, surgical consent, pre-op evaluation, timeout performed, IV checked, risks and benefits discussed and monitors and equipment checked Spinal Block Patient position: sitting Prep: site prepped and draped and DuraPrep Patient monitoring: heart rate, continuous pulse ox and blood pressure Approach: midline Location: L3-4 Injection technique: single-shot Needle Needle type: Sprotte  Needle gauge: 24 G Needle length: 9 cm Assessment Sensory level: T6 Additional Notes Expiration date of kit checked and confirmed. Patient tolerated procedure well, without complications.

## 2013-12-10 NOTE — Transfer of Care (Signed)
Immediate Anesthesia Transfer of Care Note  Patient: Megan Stevens  Procedure(s) Performed: Procedure(s): CESAREAN SECTION and removal of cervical cerclage (N/A)  Patient Location: PACU  Anesthesia Type:Spinal  Level of Consciousness: awake  Airway & Oxygen Therapy: Patient Spontanous Breathing  Post-op Assessment: Report given to PACU RN  Post vital signs: Reviewed and stable  Complications: No apparent anesthesia complications

## 2013-12-10 NOTE — Anesthesia Postprocedure Evaluation (Addendum)
Anesthesia Post Note  Patient: Megan Stevens  Procedure(s) Performed: Procedure(s) (LRB): CESAREAN SECTION and removal of cervical cerclage (N/A)  Anesthesia type: Spinal  Patient location: PACU  Post pain: Pain level controlled  Post assessment: Post-op Vital signs reviewed  Last Vitals: BP 118/69  Pulse 93  Temp(Src) 36.4 C (Oral)  Resp 18  Ht 5\' 5"  (1.651 m)  Wt 194 lb 12.8 oz (88.361 kg)  BMI 32.42 kg/m2  SpO2 100%  LMP 05/27/2013  Post vital signs: Reviewed  Level of consciousness: sedated  Complications: No apparent anesthesia complications

## 2013-12-10 NOTE — Progress Notes (Signed)
Patient ID: Megan Stevens, female   DOB: 10/26/1985, 28 y.o.   MRN: [id number]  Called to see pt due to new complaint of feeling like something is in her vag. She had a BM approx 30 mins ago and thinks she may have 'pushed too hard'.   Feeling the same intermittent crampiness as always. No bldg.  Cx examined digitally as rec by Dr H-S: Os 2-3cm/50%/ cerclage palp; membranes palp at cx but not hourglassing significantly into vag  Continue to observe.  Cam HaiSHAW, Danyl Deems 12/10/2013 12:37 AM

## 2013-12-10 NOTE — Anesthesia Postprocedure Evaluation (Signed)
  Anesthesia Post-op Note  Patient: Megan Stevens  Procedure(s) Performed: Procedure(s): CESAREAN SECTION and removal of cervical cerclage (N/A)  Patient Location: 302  Anesthesia Type:Spinal  Level of Consciousness: awake  Airway and Oxygen Therapy: Patient Spontanous Breathing  Post-op Pain: mild  Post-op Assessment: Patient's Cardiovascular Status Stable and Respiratory Function Stable  Post-op Vital Signs: stable  Complications: No apparent anesthesia complications

## 2013-12-10 NOTE — Op Note (Signed)
Megan SatJulia A Wyrick 11/27/2013 - 12/10/2013  8:52 AM  PATIENT:  Megan Stevens  28 y.o. female  PRE-OPERATIVE DIAGNOSIS:  repeat cesarean, premature rupture of membranes, breech presentation  POST-OPERATIVE DIAGNOSIS:  repeat cesarean, premature rupture of membranes, breech presentation  PROCEDURE:  Procedure(s): CESAREAN SECTION and removal of cervical cerclage (N/A)  SURGEON:  Surgeon(s) and Role:    * Willodean Rosenthalarolyn Harraway-Smith, MD - Primary  ANESTHESIA:   spinal  EBL:  Total I/O In: 2300 [I.V.:2300] Out: 825 [Urine:125; Blood:700]  BLOOD ADMINISTERED:none  DRAINS: none   LOCAL MEDICATIONS USED:  MARCAINE     SPECIMEN:  Source of Specimen:  placenta  DISPOSITION OF SPECIMEN:  PATHOLOGY  COUNTS:  YES  TOURNIQUET:  * No tourniquets in log *  DICTATION: .Note written in EPIC  PLAN OF CARE: Admit to inpatient   PATIENT DISPOSITION:  PACU - hemodynamically stable.   Delay start of Pharmacological VTE agent (>24hrs) due to surgical blood loss or risk of bleeding: yes  COMPLICATIONS: None immediate  INDICATIONS: Megan SatJulia A Gusler is a 28 y.o. [id number] at 4836w1d here for cesarean section secondary to the indications listed under preoperative diagnosis; please see preoperative note for further details.  The risks of cesarean section were discussed with the patient including but were not limited to: bleeding which may require transfusion or reoperation; infection which may require antibiotics; injury to bowel, bladder, ureters or other surrounding organs; injury to the fetus; need for additional procedures including hysterectomy in the event of a life-threatening hemorrhage; placental abnormalities wth subsequent pregnancies, incisional problems, thromboembolic phenomenon and other postoperative/anesthesia complications.   The patient concurred with the proposed plan, giving informed written consent for the procedure.  The patient was initially consented for a bilateral tubal ligation  which she decided against in the operating theater.  FINDINGS:  Viable female infant in cephalic presentation.  Apgars 6 and 9.  Clear amniotic fluid.  Intact placenta, three vessel cord.  Normal uterus, fallopian tubes and ovaries bilaterally.   PROCEDURE IN DETAIL:  The patient preoperatively received intravenous antibiotics and had sequential compression devices applied to her lower extremities.  She was then taken to the operating room where spinal anesthesia was administered and was found to be adequate. She was placed in the dorsal lithotomy position and a speculum was placed in the vagina.  The cerclage was identified and cut and removed intact. A foley catheter was placed into her bladder and attached to constant gravity.  She was then placed in a dorsal supine position with a leftward tilt, and prepped and draped in a sterile manner.  After an adequate timeout was performed, a Pfannenstiel skin incision was made with scalpel and carried through to the underlying layer of fascia. The fascia was incised in the midline, and this incision was extended bilaterally using the Mayo scissors.  Kocher clamps were applied to the superior aspect of the fascial incision and the underlying rectus muscles were dissected off bluntly. A similar process was carried out on the inferior aspect of the fascial incision. The rectus muscles were separated in the midline bluntly and the peritoneum was entered bluntly. Attention was turned to the lower uterine segment where a low transverse hysterotomy incision was made with a scalpel and extended bilaterally bluntly.  The infant was successfully delivered, the cord was clamped and cut and the infant was handed over to awaiting neonatology team. Uterine massage was then administered, and the placenta delivered intact with a three-vessel cord. The uterus was then  cleared of clot and debris.  The hysterotomy was closed with 0 Vicryl in a running locked fashion, and an imbricating  layer was also placed with the same suture. The pelvis was cleared of all clot and debris. Hemostasis was confirmed on all surfaces.  The peritoneum and the muscles were reapproximated using 0 Vicryl in 1 interrupted suture. The fascia was then closed using 0 Vicryl in a running fashion.  The subcutaneous layer was irrigated, then reapproximated with 3-0 vicryl in a running locked fashion, and the skin was closed with a 4-0 Vicryl subcuticular stitch.  30 cc of 0.5% marcaine was injected into the incision and benzoin and steristrips were applied.  The patient tolerated the procedure well. Sponge, lap, instrument and needle counts were correct x 2.  She was taken to the recovery room in stable condition.

## 2013-12-10 NOTE — Consult Note (Signed)
Neonatology Note:  Attendance at C-section:  I was asked by Dr. Harraway-Smith to attend this repeat C/S at 28 1/[redacted] weeks GA due to preterm labor, previous C/S, and vaginal bleeding. The mother is a G12P1A10 B pos, GBS positive with a history of preterm delivery, incompetent cervix, post-partum depression, and cigarette smoking. ROM 14 days prior to delivery, fluid clear. The mother received IV antibiotics followed by oral Amoxicillin and Erythromycin through 3/1. She also got Betamethasone on 2/22-23. She has been afebrile. At delivery, the baby did not cry spontaneously, but responded quickly to stimulation. We bulb suctioned, dried him, and placed him into the portawarmer bag for temp support. We placed the neopuff on him and could hear good air exchange; he had some periodic breathing and his HR drifted down to the 90-100 range once, so we gave a few PPV breaths. After that, he maintained his HR well on CPAP of 5 and about 30% FIO2. He was seen briefly in the OR by his mother, then was transported to the NICU on neopuff CPAP. Ap 6/9.  Megan Stevens C. Melisia Leming, MD  

## 2013-12-10 NOTE — Anesthesia Preprocedure Evaluation (Addendum)
Anesthesia Evaluation  Patient identified by MRN, date of birth, ID band Patient awake    Reviewed: Allergy & Precautions, H&P , NPO status , Patient's Chart, lab work & pertinent test results  Airway Mallampati: III TM Distance: >3 FB Neck ROM: Full    Dental no notable dental hx. (+) Teeth Intact   Pulmonary Current Smoker,  breath sounds clear to auscultation  Pulmonary exam normal       Cardiovascular negative cardio ROS  Rhythm:Regular Rate:Normal     Neuro/Psych PSYCHIATRIC DISORDERS Depression negative neurological ROS     GI/Hepatic negative GI ROS, Neg liver ROS,   Endo/Other  negative endocrine ROS  Renal/GU negative Renal ROS  negative genitourinary   Musculoskeletal negative musculoskeletal ROS (+)   Abdominal (+) + obese,   Peds  Hematology  (+) anemia ,   Anesthesia Other Findings   Reproductive/Obstetrics (+) Pregnancy Preterm Labor 28 weeks  Previous C/Section                          Anesthesia Physical Anesthesia Plan  ASA: II and emergent  Anesthesia Plan: Spinal   Post-op Pain Management:    Induction:   Airway Management Planned: Natural Airway  Additional Equipment:   Intra-op Plan:   Post-operative Plan:   Informed Consent: I have reviewed the patients History and Physical, chart, labs and discussed the procedure including the risks, benefits and alternatives for the proposed anesthesia with the patient or authorized representative who has indicated his/her understanding and acceptance.   Dental advisory given  Plan Discussed with: Anesthesiologist, CRNA and Surgeon  Anesthesia Plan Comments:        Anesthesia Quick Evaluation

## 2013-12-10 NOTE — Progress Notes (Signed)
Patient ID: Megan Stevens, female   DOB: 05/18/1986, 28 y.o.   MRN: [id number] Called to see pt for some red spotting with wiping. Continues with same cramping as before.  SSE: cx visually 2cm/50 Toco: irritability  Will given one time dose of Percocet x2 and reeval if discomfort persists.  Cam HaiSHAW, Midori Dado 12/10/2013 4:41 AM

## 2013-12-10 NOTE — Brief Op Note (Signed)
11/27/2013 - 12/10/2013  8:52 AM  PATIENT:  Megan Stevens  28 y.o. female  PRE-OPERATIVE DIAGNOSIS:  repeat cesarean, premature rupture of membranes, breech presentation  POST-OPERATIVE DIAGNOSIS:  repeat cesarean, premature rupture of membranes, breech presentation  PROCEDURE:  Procedure(s): CESAREAN SECTION and removal of cervical cerclage (N/A)  SURGEON:  Surgeon(s) and Role:    * Willodean Rosenthalarolyn Harraway-Smith, MD - Primary  ANESTHESIA:   spinal  EBL:  Total I/O In: 2300 [I.V.:2300] Out: 825 [Urine:125; Blood:700]  BLOOD ADMINISTERED:none  DRAINS: none   LOCAL MEDICATIONS USED:  MARCAINE     SPECIMEN:  Source of Specimen:  placenta  DISPOSITION OF SPECIMEN:  PATHOLOGY  COUNTS:  YES  TOURNIQUET:  * No tourniquets in log *  DICTATION: .Note written in EPIC  PLAN OF CARE: Admit to inpatient   PATIENT DISPOSITION:  PACU - hemodynamically stable.   Delay start of Pharmacological VTE agent (>24hrs) due to surgical blood loss or risk of bleeding: yes

## 2013-12-10 NOTE — Progress Notes (Signed)
Patient ID: Megan Stevens, female   DOB: 12-02-1985, 28 y.o.   MRN: [id number] ACULTY PRACTICE ANTEPARTUM COMPREHENSIVE PROGRESS NOTE  Megan Stevens is a 28 y.o. [id number] at [redacted]w[redacted]d  who is admitted for Preterm labor/PROM.   Fetal presentation is cephalic. Length of Stay:  13  Days  Subjective: Pt c/o pain in the  Pelvis.  She c/o increased pressure earlier and now reports that she is having contractions.  She also reports that she just started bleeding.  She reports that she sever pain just started. Pt received a does of Procardia and a dose of terbutaline with no mild decrease in pain but, not significant. Patient reports good fetal movement.   Vitals:  Blood pressure 135/71, pulse 112, temperature 98.2 F (36.8 C), temperature source Oral, resp. rate 20, height 5\' 5"  (1.651 m), weight 194 lb 12.8 oz (88.361 kg), last menstrual period 05/27/2013, SpO2 98.00%. Physical Examination: General appearance - in mild to moderate distress Abdomen - gravid; pt writihing in pain Cervical Exam: Evaluated by digital exam., Dilation: 2-3 difficult exam due to patinet not being aboe to tolerate the exam. Extremities: extremities normal, atraumatic, no cyanosis or edema Membranes:ruptured  Fetal Monitoring:  Baseline: 150's bpm, Variability: Good {> 6 bpm) and Decelerations: Absent  Labs:  Results for orders placed during the hospital encounter of 11/27/13 (from the past 24 hour(s))  CBC   Collection Time    12/09/13  7:48 AM      Result Value Ref Range   WBC 12.7 (*) 4.0 - 10.5 K/uL   RBC 4.02  3.87 - 5.11 MIL/uL   Hemoglobin 10.6 (*) 12.0 - 15.0 g/dL   HCT 78.2 (*) 95.6 - 21.3 %   MCV 79.4  78.0 - 100.0 fL   MCH 26.4  26.0 - 34.0 pg   MCHC 33.2  30.0 - 36.0 g/dL   RDW 08.6  57.8 - 46.9 %   Platelets 237  150 - 400 K/uL  GLUCOSE TOLERANCE, 1 HOUR   Collection Time    12/09/13  9:42 AM      Result Value Ref Range   Glucose, 1 Hour GTT 114  70 - 140 mg/dL    Imaging Studies:        Medications:  Scheduled . docusate sodium  100 mg Oral Daily  . hydroxyprogesterone caproate  250 mg Intramuscular Weekly  . prenatal multivitamin  1 tablet Oral Q1200   I have reviewed the patient's current medications.  ASSESSMENT: Patient Active Problem List   Diagnosis Date Noted  . Preterm premature rupture of membranes in third trimester 11/27/2013  . Short cervix affecting pregnancy 10/31/2013  . Inadequate social support 09/27/2013  . Pregnancy complicated by noncompliance in first trimester, antepartum 09/23/2013  . Supervision of high risk pregnancy in first trimester 08/22/2013  . H/O cervical incompetence 08/22/2013  . Abnormal Pap smear 08/08/2013  . History of preterm labor 08/01/2013  . History of cesarean section 08/01/2013  . 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 The risks of surgery were discussed in detail with the patient including but not limited to: bleeding which may require transfusion or reoperation; infection which may require prolonged hospitalization or re-hospitalization and antibiotic therapy; injury to bowel, bladder, ureters and major vessels or other surrounding organs; need for additional procedures including laparotomy; thromboembolic phenomenon, incisional problems and other postoperative or anesthesia complications.  The postoperative expectations were also discussed in  detail. I also explained to her tha tthere is a 3-01/999 risk of failure from the bilateral tubal ligation. The patient also understands the alternative treatment options which were discussed in full. All questions were answered.  She was told that she will be contacted by our surgical scheduler regarding the time and date of her surgery; routine preoperative instructions of having nothing to eat or drink after midnight on the day prior to surgery and also coming to the hospital 1 1/2 hours prior  to her time of surgery were also emphasized.  She was told she may be called for a preoperative appointment about a week prior to surgery and will be given further preoperative instructions at that visit. Printed patient education handouts about the procedure were given to the patient to review at home.     HARRAWAY-SMITH, Marieta Markov 12/10/2013,6:43 AM

## 2013-12-11 LAB — CBC
HCT: 29.7 % — ABNORMAL LOW (ref 36.0–46.0)
Hemoglobin: 10.1 g/dL — ABNORMAL LOW (ref 12.0–15.0)
MCH: 27 pg (ref 26.0–34.0)
MCHC: 34 g/dL (ref 30.0–36.0)
MCV: 79.4 fL (ref 78.0–100.0)
PLATELETS: 218 10*3/uL (ref 150–400)
RBC: 3.74 MIL/uL — ABNORMAL LOW (ref 3.87–5.11)
RDW: 13.5 % (ref 11.5–15.5)
WBC: 13 10*3/uL — ABNORMAL HIGH (ref 4.0–10.5)

## 2013-12-11 NOTE — Progress Notes (Signed)
Subjective:not much pain, ambulating Postpartum Day 1: Cesarean Delivery Patient reports tolerating PO and no problems voiding.    Objective: Vital signs in last 24 hours: Temp:  [97.5 F (36.4 C)-98.4 F (36.9 C)] 98.1 F (36.7 C) (03/08 0548) Pulse Rate:  [81-117] 93 (03/08 0548) Resp:  [15-24] 18 (03/08 0548) BP: (96-129)/(34-81) 106/66 mmHg (03/08 0548) SpO2:  [97 %-100 %] 100 % (03/08 0548)  Physical Exam:  General: alert, cooperative and no distress Lochia: appropriate Uterine Fundus: firm Incision: no significant drainage DVT Evaluation: No evidence of DVT seen on physical exam.   Recent Labs  12/10/13 0615 12/11/13 0515  HGB 11.3* 10.1*  HCT 33.6* 29.7*    Assessment/Plan: Status post Cesarean section. Doing well postoperatively.  Continue current care.  Megan Stevens 12/11/2013, 7:27 AM

## 2013-12-11 NOTE — Progress Notes (Signed)
Post Partum Day 1  Subjective: no complaints, up ad lib, voiding, tolerating PO and + flatus  Objective: Blood pressure 106/66, pulse 93, temperature 98.1 F (36.7 C), temperature source Oral, resp. rate 18, height 5\' 5"  (1.651 m), weight 88.361 kg (194 lb 12.8 oz), last menstrual period 05/27/2013, SpO2 100.00%, unknown if currently breastfeeding.  Physical Exam:  General: alert, cooperative and no distress Lochia: appropriate Uterine Fundus: firm Incision: healing well DVT Evaluation: No evidence of DVT seen on physical exam.   Recent Labs  12/10/13 0615 12/11/13 0515  HGB 11.3* 10.1*  HCT 33.6* 29.7*    Assessment/Plan: Discharge home, Breastfeeding, Social Work consult and Contraception desires Mirena, had signed papers for BTL, but had reservations   LOS: 14 days   Michaelene SongHall, Pedro Oldenburg C 12/11/2013, 8:50 AM

## 2013-12-11 NOTE — Plan of Care (Signed)
Problem: Discharge Progression Outcomes Goal: Barriers To Progression Addressed/Resolved Outcome: Completed/Met Date Met:  12/11/13 Voiding without difficulty. Goal: Complications resolved/controlled Outcome: Completed/Met Date Met:  12/11/13 Was unable to void.Has voided a few times qs amount. Goal: Pain controlled with appropriate interventions Outcome: Completed/Met Date Met:  12/11/13 Good pain control on po Motrin and Percocet.
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
## 2013-12-12 MED ORDER — DOCUSATE SODIUM 100 MG PO CAPS: 100.0000 mg | ORAL_CAPSULE | Freq: Two times a day (BID) | ORAL | Status: DC

## 2013-12-12 MED ORDER — IBUPROFEN 600 MG PO TABS: 600.0000 mg | ORAL_TABLET | Freq: Four times a day (QID) | ORAL | Status: DC

## 2013-12-12 MED ORDER — OXYCODONE-ACETAMINOPHEN 5-325 MG PO TABS: 1.0000 | ORAL_TABLET | ORAL | Status: DC | PRN

## 2013-12-12 NOTE — Plan of Care (Signed)
Problem: Discharge Progression Outcomes Goal: Activity appropriate for discharge plan Outcome: Completed/Met Date Met:  12/12/13 Walks frequently to NICU and tolerates this well.

## 2013-12-12 NOTE — Lactation Note (Signed)
This note was copied from the chart of Megan Kathrine CordsJulia Bordon. Lactation Consultation Note        Follow up consult with this mom of a NICU baby, now 56 hours post partum, and baby is now 28 3/7 weeks corected gestation. Mom is reluctant to pump due to very sensitive breasts/nipples, but realizes the benefits for her baby. I reviewed the NICU booklet on providing EBM with mom, and showed her her transitional milk in her breasts with mild hand expression. [facility] on discharge. i will follow this family in the NICU.  Patient Name: Megan Stevens Reason for consult: Follow-up assessment;NICU baby   Maternal Data    Feeding Feeding Type: Formula Length of feed: 10 min  LATCH Score/Interventions                      Lactation Tools Discussed/Used Tools: Pump Breast pump type: Double-Electric Breast Pump WIC Program: Yes (mom knows to call for DEP) Pump Review: Setup, frequency, and cleaning;Milk Storage;Other (comment) (hand expression taught, review of NICU booklet on providing EBm for a NICU baby, premie setting) Initiated by:: lactation  Date initiated:: 12/11/13 (mom has not been pumping - enocuraged to do so if she wants to provide EBm for her baby)   Consult Status Consult Status: Follow-up Date: 12/13/13 Follow-up type: In-patient    Alfred LevinsLee, Jewelz Ricklefs Anne Stevens, 3:59 PM

## 2013-12-12 NOTE — Progress Notes (Signed)
CSW is familiar with this MOB from the admission of her first son at 37 weeks in 12/2010. MOB recognized CSW also and seemed very happy to see CSW, welcoming CSW into her room with a hug. CSW checked in with MOB as she initially met with weekend CSW. MOB reports that she, her 27 year old and baby are doing very well. She is still with the same partner she was with when she had her first son and states FOB is also the same. He is not involved with her 27 year old and she does not plan for him to be involved with this baby either. MOB inquired about staying at the [facility], as she did 3 years ago, since she lives in [place]. CSW informed MOB that, unfortunately, [facility] is no longer available. MOB states she now has a car. CSW offered gas cards and she accepted with gratitude. She states she has not gotten baby items yet and does not still have items from her first child, as this pregnancy was not planned. CSW asked her to make preparations as she can and to let CSW know if she is having difficulty getting the basics prior to baby's discharge. MOB would like to apply for SSI, however, baby does not automatically qualify as he is 50g over the weight limit. MOB understands this. MOB asked CSW about the drug screen and the "blunt" she smoked on her birthday ([birth year]). MOB is very concerned about this. She states she did not know she was pregnant and does not smoke marijuana on a regular basis. She states she was only smoking because it was her birthday. She asked CSW if DSS was going to come take her children. CSW explained that if the baby's meconium is positive, which is possible, CPS will be involved, but that it is very unlikely for her children to be removed if that is the only concern. CSW encouraged MOB to not stress about it at this point, because there is no way to change the past, but rather to commit to not using drugs from this point forward. She states no plans to use marijuana and denies all  other drug use. She does state that she was prescribed pain medication when she got her cerclage and that she was prescribed Fioricet. CSW will keep this in mind when reviewing drug screen results. CSW provided MOB with contact information and asked her to call any time. MOB was very Patent attorney. CSW will assist her with SSI application at a later time as she wanted to go visit baby at this time.

## 2013-12-12 NOTE — Progress Notes (Signed)
Subjective:not much pain, ambulating Postpartum Day 1: Cesarean Delivery Patient reports tolerating PO and no problems voiding.    Objective: Vital signs in last 24 hours: Temp:  [97.5 F (36.4 C)-98 F (36.7 C)] 98 F (36.7 C) (03/09 0534) Pulse Rate:  [83-99] 98 (03/09 0534) Resp:  [18] 18 (03/09 0534) BP: (117-129)/(66-81) 117/68 mmHg (03/09 0534) SpO2:  [97 %-100 %] 97 % (03/09 0534)  Physical Exam:  General: alert, cooperative and no distress Lochia: appropriate Uterine Fundus: firm Incision: no significant drainage DVT Evaluation: No evidence of DVT seen on physical exam.   Recent Labs  12/10/13 0615 12/11/13 0515  HGB 11.3* 10.1*  HCT 33.6* 29.7*    Assessment/Plan: Status post Cesarean section. Doing well postoperatively.  Continue current care.  Michaelene SongHall, Jonathan C 12/12/2013, 10:59 AM  I examined pt and agree with documentation above and resident plan of care. [facility],WALIDAH

## 2013-12-12 NOTE — Discharge Summary (Signed)
Obstetric Discharge Summary Reason for Admission: induction of labor, rupture of membranes and breech presentation Prenatal Procedures: ultrasound Intrapartum Procedures: LTCS Postpartum Procedures: none Complications-Operative and Postpartum: none Hemoglobin  Date Value Ref Range Status  12/11/2013 10.1* 12.0 - 15.0 g/dL Final     HCT  Date Value Ref Range Status  12/11/2013 29.7* 36.0 - 46.0 % Final    Operative Note:  The patient preoperatively received intravenous antibiotics and had sequential compression devices applied to her lower extremities. She was then taken to the operating room where spinal anesthesia was administered and was found to be adequate. She was placed in the dorsal lithotomy position and a speculum was placed in the vagina. The cerclage was identified and cut and removed intact. A foley catheter was placed into her bladder and attached to constant gravity. She was then placed in a dorsal supine position with a leftward tilt, and prepped and draped in a sterile manner. After an adequate timeout was performed, a Pfannenstiel skin incision was made with scalpel and carried through to the underlying layer of fascia. The fascia was incised in the midline, and this incision was extended bilaterally using the Mayo scissors. Kocher clamps were applied to the superior aspect of the fascial incision and the underlying rectus muscles were dissected off bluntly. A similar process was carried out on the inferior aspect of the fascial incision. The rectus muscles were separated in the midline bluntly and the peritoneum was entered bluntly. Attention was turned to the lower uterine segment where a low transverse hysterotomy incision was made with a scalpel and extended bilaterally bluntly. The infant was successfully delivered, the cord was clamped and cut and the infant was handed over to awaiting neonatology team. Uterine massage was then administered, and the placenta delivered intact with a  three-vessel cord. The uterus was then cleared of clot and debris. The hysterotomy was closed with 0 Vicryl in a running locked fashion, and an imbricating layer was also placed with the same suture. The pelvis was cleared of all clot and debris. Hemostasis was confirmed on all surfaces. The peritoneum and the muscles were reapproximated using 0 Vicryl in 1 interrupted suture. The fascia was then closed using 0 Vicryl in a running fashion. The subcutaneous layer was irrigated, then reapproximated with 3-0 vicryl in a running locked fashion, and the skin was closed with a 4-0 Vicryl subcuticular stitch. 30 cc of 0.5% marcaine was injected into the incision and benzoin and steristrips were applied. The patient tolerated the procedure well. Sponge, lap, instrument and needle counts were correct x 2. She was taken to the recovery room in stable condition.  Physical Exam:  General: alert, cooperative and no distress Lochia: appropriate Uterine Fundus: firm Incision: healing well DVT Evaluation: No evidence of DVT seen on physical exam.  Discharge Diagnoses: Incompetent cervix, Premature labor and PPROM  Discharge Information: Date: 12/12/2013 Activity: pelvic rest Diet: routine Medications: Ibuprofen, Colace and Percocet Condition: stable Instructions: refer to practice specific booklet Discharge to: home  Hospital Course:  Megan Stevens was admitted on February 22nd for incompatent cervix treated outpaitnet with cerclage for PROM. She was delivered on 3/7 by RCTS for PTL and has been recovering without incident. She is meeting discharge criteria.   Newborn Data: Live born female  Birth Weight: 2 lb 12.1 oz (1250 g) APGAR: 6, 9  Staying in NICU. Peds to discharge when appropriate.  Megan Stevens 12/12/2013, 8:08 AM  Evaluation and management procedures were performed by Resident physician  under my supervision/collaboration. Chart reviewed, patient examined by me and I agree with management and  plan.  Megan Stevens, CNM 12/13/2013 10:13 AM

## 2013-12-12 NOTE — Discharge Instructions (Signed)
Dressing Change A dressing is a material placed over wounds. It keeps the wound clean, dry, and protected from further injury. This provides an environment that favors wound healing.  BEFORE YOU BEGIN  Get your supplies together. Things you may need include:  Saline solution.  Flexible gauze dressing.  Medicated cream.  Tape.  Gloves.  Abdominal dressing pads.  Gauze squares.  Plastic bags.  Take pain medicine 30 minutes before the dressing change if you need it.  Take a shower before you do the first dressing change of the day. Use plastic wrap or a plastic bag to prevent the dressing from getting wet. REMOVING YOUR OLD DRESSING   Wash your hands with soap and water. Dry your hands with a clean towel.  Put on your gloves.  Remove any tape.  Carefully remove the old dressing. If the dressing sticks, you may dampen it with warm water to loosen it, or follow your caregiver's specific directions.  Remove any gauze or packing tape that is in your wound.  Take off your gloves.  Put the gloves, tape, gauze, or any packing tape into a plastic bag. CHANGING YOUR DRESSING  Open the supplies.  Take the cap off the saline solution.  Open the gauze package so that the gauze remains on the inside of the package.  Put on your gloves.  Clean your wound as told by your caregiver.  If you have been told to keep your wound dry, follow those instructions.  Your caregiver may tell you to do one or more of the following:  Pick up the gauze. Pour the saline solution over the gauze. Squeeze out the extra saline solution.  Put medicated cream or other medicine on your wound if you have been told to do so.  Put the solution soaked gauze only in your wound, not on the skin around it.  Pack your wound loosely or as told by your caregiver.  Put dry gauze on your wound.  Put abdominal dressing pads over the dry gauze if your wet gauze soaks through.  Tape the abdominal dressing  pads in place so they will not fall off. Do not wrap the tape completely around the affected part (arm, leg, abdomen).  Wrap the dressing pads with a flexible gauze dressing to secure it in place.  Take off your gloves. Put them in the plastic bag with the old dressing. Tie the bag shut and throw it away.  Keep the dressing clean and dry until your next dressing change.  Wash your hands. SEEK MEDICAL CARE IF:  Your skin around the wound looks red.  Your wound feels more tender or sore.  You see pus in the wound.  Your wound smells bad.  You have a fever.  Your skin around the wound has a rash that itches and burns.  You see black or yellow skin in your wound that was not there before.  You feel nauseous, throw up, and feel very tired. Document Released: 10/30/2004 Document Revised: 12/15/2011 Document Reviewed: 08/04/2011 [facility] Patient Information 2014 [place], [place]. Breast Pumping Tips Pumping your breast milk is a good way to stimulate milk production and have a steady supply of breast milk for your infant. Pumping is most helpful during your infant's growth spurts, when involving dad or a family member, or when you are away. There are several types of pumps available. They can be purchased at a baby or maternity store. You can begin pumping soon after delivery, but some experts believe  that you should wait about four weeks to give your infant a bottle. In general, the more you breastfeed or pump, the more milk you will have for your infant. It is also important to take good care of yourself. This will reduce stress and help your body to create a healthy supply of milk. Your caregiver or lactation consultant can give you the information and support you need in your efforts to breastfeed your infant. PUMPING BREAST MILK  Follow the tips below for successful breast pumping. Take care of yourself.  Drink enough water or fluids to keep urine clear or pale yellow. You may notice  a thirsty feeling while breastfeeding. This is because your body needs more water to make breast milk. Keep a large water bottle handy. Make healthy drink choices such as unsweetened fruit juice, milk and water. Limit soda, coffee, and alcohol (wait 2 hours to feed or pump if you have an alcoholic drink.)  Eat a healthy, well-balanced diet rich in fruits, vegetables, and whole grains.  Exercise as recommended by your caregiver.  Get plenty of sleep. Sleep when your infant sleeps. Ask friends and family for help if you need time to nap or rest.  Do not smoke. Smoking can lower your milk supply and harm your infant. If you need help quitting, ask your caregiver for a program recommendation.  Ask your caregiver about birth control options. Birth control pills may lower your milk supply. You may be advised to use condoms or other forms of birth control. Relax and pump Stimulating your let-down reflex is the key to successful and effective pumping. This makes the milk in all parts of the breast flow more freely.   It is easier to pump breast milk (and breastfeed) while you are relaxed. Find techniques that work for you. Quiet private spaces, breast massage, soothing heat placed on the breast, music, and pictures or a tape recording of your infant may help you to relax and "let down" your milk. If you have difficulty with your let down, try smelling one of your infant's blankets or an item of clothing he or she has worn while you are pumping.  When pumping, place the special suction cup (flange) directly over the nipple. It may be uncomfortable and cause nipple damage if it is not placed properly or is the wrong size. Applying a small amount of purified or modified lanolin to your nipple and the areola may help increase your comfort level. Also, you can change the speed and suction of many electric pumps to your comfort level. Your caregiver or lactation consultant can help you with this.  If pumping  continues to be painful, or you feel you are not getting very much milk when you pump, you may need a different type of pump. A lactation consultant can help you determine if this is the case.  If you are with your infant, feed him or her on demand and try pumping after each feeding. This will boost your production, even if milk does not come out. You may not be able to pump much milk at first, but keep up the routine, and this will change.  If you are working or away from your infant for several hours, try pumping for about 15 minutes every 2 to 3 hours. Pump both breasts at the same time if you can.  If your infant has a formula feeding, make sure you pump your milk around the same time to maintain your supply.  Begin pumping  breast milk a few weeks before you return to work. This will help you develop techniques that work for you and will be able to store extra milk.  Find a source of breastfeeding information that works well for you. TIPS FOR STORING BREAST MILK  Store breast milk in a sealable sterile bag, jar, or container provided with your pumping supplies.  Store milk in small amounts close to what your infant is drinking at each feeding.  Cool pumped milk in a refrigerator or cooler. Pumped milk can last at the back of the refrigerator for 3 to 8 days.  Place cooled milk at the back of the freezer for up to 3 months.  Thaw the milk in its container or bag in warm water up to 24 hours in advance. Do not use a microwave to thaw or heat milk. Do not refreeze the milk after it has been thawed.  Breast milk is safe to drink when left at room temperature (mid 70s or colder) for 4 to 8 hours. After that, throw it away.  Milk fat can separate and look funny. The color can vary slightly from day to day. This is normal. Always shake the milk before using it to mix the fat with the more watery portion. SEEK MEDICAL CARE IF:   You are having trouble pumping or feeding your infant.  You  are concerned that you are not making enough milk.  You have nipple pain, soreness, or redness.  You have other questions or concerns related to you or your infant. Document Released: 03/12/2010 Document Revised: 12/15/2011 Document Reviewed: 03/12/2010 [facility] Patient Information 2014 [place], [place].

## 2013-12-13 ENCOUNTER — Encounter (HOSPITAL_COMMUNITY): Payer: Self-pay | Admitting: Obstetrics & Gynecology

## 2013-12-13 NOTE — Progress Notes (Signed)
Pt ready for discharge. Informed that Dr. Eulah CitizenWould be up soon. Pt stated that she was going to visit the baby in [place] with her brother. Pt. Instructed th at when Dr. Arrived I would given her a call. Pt. Left for [place]. 1000a.m. [place] called. ,Stated pt left at 0945a.m., with brother. 1100a.m. Dr Caroll Rancherontant notified of pt. Status. Call was made to pt.'s Mother. Mother states pt is on her way home from hosipital. Pt is stillin [place].

## 2013-12-13 NOTE — Discharge Summary (Signed)
`````  Attestation of Attending Supervision of Advanced Practitioner: Evaluation and management procedures were performed by the PA/NP/CNM/OB Fellow under my supervision/collaboration. Chart reviewed and agree with management and plan.  Makyiah Lie V 12/13/2013 11:38 PM

## 2013-12-15 ENCOUNTER — Inpatient Hospital Stay (HOSPITAL_COMMUNITY)
Admission: AD | Admit: 2013-12-15 | Discharge: 2013-12-15 | Disposition: A | Discharge status: Home / Self Care | Payer: Medicaid Other | Source: Ambulatory Visit | Attending: Obstetrics & Gynecology | Admitting: Obstetrics & Gynecology

## 2013-12-15 ENCOUNTER — Encounter (HOSPITAL_COMMUNITY): Payer: Self-pay | Admitting: *Deleted

## 2013-12-15 DIAGNOSIS — L7682 Other postprocedural complications of skin and subcutaneous tissue: Secondary | ICD-10-CM

## 2013-12-15 DIAGNOSIS — O2390 Unspecified genitourinary tract infection in pregnancy, unspecified trimester: Secondary | ICD-10-CM

## 2013-12-15 DIAGNOSIS — N390 Urinary tract infection, site not specified: Secondary | ICD-10-CM | POA: Insufficient documentation

## 2013-12-15 DIAGNOSIS — O909 Complication of the puerperium, unspecified: Secondary | ICD-10-CM | POA: Insufficient documentation

## 2013-12-15 DIAGNOSIS — O8620 Urinary tract infection following delivery, unspecified: Secondary | ICD-10-CM

## 2013-12-15 DIAGNOSIS — O99335 Smoking (tobacco) complicating the puerperium: Secondary | ICD-10-CM | POA: Insufficient documentation

## 2013-12-15 DIAGNOSIS — R300 Dysuria: Secondary | ICD-10-CM | POA: Insufficient documentation

## 2013-12-15 LAB — URINALYSIS, ROUTINE W REFLEX MICROSCOPIC
BILIRUBIN URINE: NEGATIVE
GLUCOSE, UA: NEGATIVE mg/dL
Ketones, ur: NEGATIVE mg/dL
Nitrite: NEGATIVE
Protein, ur: NEGATIVE mg/dL
SPECIFIC GRAVITY, URINE: 1.02 (ref 1.005–1.030)
UROBILINOGEN UA: 1 mg/dL (ref 0.0–1.0)
pH: 6 (ref 5.0–8.0)

## 2013-12-15 LAB — URINE MICROSCOPIC-ADD ON

## 2013-12-15 MED ORDER — CIPROFLOXACIN HCL 500 MG PO TABS: 500.0000 mg | ORAL_TABLET | Freq: Two times a day (BID) | ORAL | Status: DC

## 2013-12-15 NOTE — Discharge Instructions (Signed)
Urinary Tract Infection  Urinary tract infections (UTIs) can develop anywhere along your urinary tract. Your urinary tract is your body's drainage system for removing wastes and extra water. Your urinary tract includes two kidneys, two ureters, a bladder, and a urethra. Your kidneys are a pair of bean-shaped organs. Each kidney is about the size of your fist. They are located below your ribs, one on each side of your spine.  CAUSES  Infections are caused by microbes, which are microscopic organisms, including fungi, viruses, and bacteria. These organisms are so small that they can only be seen through a microscope. Bacteria are the microbes that most commonly cause UTIs.  SYMPTOMS   Symptoms of UTIs may vary by age and gender of the patient and by the location of the infection. Symptoms in young women typically include a frequent and intense urge to urinate and a painful, burning feeling in the bladder or urethra during urination. Older women and men are more likely to be tired, shaky, and weak and have muscle aches and abdominal pain. A fever may mean the infection is in your kidneys. Other symptoms of a kidney infection include pain in your back or sides below the ribs, nausea, and vomiting.  DIAGNOSIS  To diagnose a UTI, your caregiver will ask you about your symptoms. Your caregiver also will ask to provide a urine sample. The urine sample will be tested for bacteria and white blood cells. White blood cells are made by your body to help fight infection.  TREATMENT   Typically, UTIs can be treated with medication. Because most UTIs are caused by a bacterial infection, they usually can be treated with the use of antibiotics. The choice of antibiotic and length of treatment depend on your symptoms and the type of bacteria causing your infection.  HOME CARE INSTRUCTIONS   If you were prescribed antibiotics, take them exactly as your caregiver instructs you. Finish the medication even if you feel better after you  have only taken some of the medication.   Drink enough water and fluids to keep your urine clear or pale yellow.   Avoid caffeine, tea, and carbonated beverages. They tend to irritate your bladder.   Empty your bladder often. Avoid holding urine for long periods of time.   Empty your bladder before and after sexual intercourse.   After a bowel movement, women should cleanse from front to back. Use each tissue only once.  SEEK MEDICAL CARE IF:    You have back pain.   You develop a fever.   Your symptoms do not begin to resolve within 3 days.  SEEK IMMEDIATE MEDICAL CARE IF:    You have severe back pain or lower abdominal pain.   You develop chills.   You have nausea or vomiting.   You have continued burning or discomfort with urination.  MAKE SURE YOU:    Understand these instructions.   Will watch your condition.   Will get help right away if you are not doing well or get worse.  Document Released: 07/02/2005 Document Revised: 03/23/2012 Document Reviewed: 10/31/2011  ExitCare Patient Information 2014 [employer].
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
## 2013-12-15 NOTE — MAU Note (Signed)
C/o incisional  pain and pain with urination;

## 2013-12-16 NOTE — MAU Provider Note (Signed)
Attestation of Attending Supervision of Advanced Practitioner (PA/CNM/NP): Evaluation and management procedures were performed by the Advanced Practitioner under my supervision and collaboration.  I have reviewed the Advanced Practitioner's note and chart, and I agree with the management and plan.  Katerine Morua, MD, FACOG Attending Obstetrician & Gynecologist Faculty Practice, [facility]  

## 2013-12-17 LAB — URINE CULTURE
Colony Count: >=100000
Special Requests: NORMAL

## 2013-12-21 ENCOUNTER — Encounter: Payer: Medicaid Other | Admitting: Obstetrics and Gynecology

## 2014-08-07 ENCOUNTER — Encounter (HOSPITAL_COMMUNITY): Payer: Self-pay | Admitting: *Deleted

## 2015-02-28 ENCOUNTER — Encounter (HOSPITAL_COMMUNITY): Payer: Self-pay

## 2015-02-28 ENCOUNTER — Emergency Department (HOSPITAL_COMMUNITY)
Admission: EM | Admit: 2015-02-28 | Discharge: 2015-03-01 | Disposition: A | Discharge status: Home / Self Care | Payer: Medicaid Other | Attending: Emergency Medicine | Admitting: Emergency Medicine

## 2015-02-28 DIAGNOSIS — Z792 Long term (current) use of antibiotics: Secondary | ICD-10-CM | POA: Diagnosis not present

## 2015-02-28 DIAGNOSIS — Z79899 Other long term (current) drug therapy: Secondary | ICD-10-CM | POA: Diagnosis not present

## 2015-02-28 DIAGNOSIS — K088 Other specified disorders of teeth and supporting structures: Secondary | ICD-10-CM | POA: Diagnosis not present

## 2015-02-28 DIAGNOSIS — K029 Dental caries, unspecified: Secondary | ICD-10-CM | POA: Insufficient documentation

## 2015-02-28 DIAGNOSIS — Z791 Long term (current) use of non-steroidal anti-inflammatories (NSAID): Secondary | ICD-10-CM | POA: Diagnosis not present

## 2015-02-28 DIAGNOSIS — K0889 Other specified disorders of teeth and supporting structures: Secondary | ICD-10-CM

## 2015-02-28 MED ORDER — TRAMADOL HCL 50 MG PO TABS: 50.0000 mg | ORAL_TABLET | Freq: Four times a day (QID) | ORAL | Status: DC | PRN

## 2015-02-28 MED ORDER — AMOXICILLIN 500 MG PO CAPS: 500.0000 mg | ORAL_CAPSULE | Freq: Three times a day (TID) | ORAL | Status: DC

## 2015-02-28 NOTE — ED Provider Notes (Signed)
CSN: [id number]     Arrival date & time 02/28/15  2319 History  This chart was scribed for Megan Stevens Megan Brownstein, MD by Modena JanskyAlbert Thayil, ED Scribe. This patient was seen in room APA02/APA02 and the patient's care was started at 11:50 PM.   Chief Complaint  Patient presents with  . Dental Pain   The history is provided by the patient. No language interpreter was used.   HPI Comments: Megan Stevens is a 29 y.o. female who presents to the Emergency Department complaining of constant moderate dental pain that started about a week ago. She states that she has been having pain to the right bottom teeth toward the back for the past week. She reports that she suspects the affected area is her wisdom teeth. She reports no modifying factors. She denies any prior hx of pain in today's affected area.   Past Medical History  Diagnosis Date  . Abnormal pap   . Miscarriage 12/24/2012  . Postpartum depression   . Abnormal Pap smear   . Vaginal bleeding in pregnancy 04/19/2013  . History of preterm labor 08/01/2013  . Pregnant 08/01/2013  . History of cesarean section 08/01/2013  . Abnormal Pap smear 08/08/2013    Pap CIN2/CIN3/CIS(HSIL)  Needs colpo   Past Surgical History  Procedure Laterality Date  . Cesarean section    . Dilation and curettage of uterus  2011  . Finger fracture surgery Right     pinky as child  . Cervical cerclage N/A 10/04/2013    Procedure: CERCLAGE CERVICAL;  Surgeon: Tilda BurrowJohn V Ferguson, MD;  Location: [facility];  Service: Gynecology;  Laterality: N/A;  . Cesarean section N/A 12/10/2013    Procedure: CESAREAN SECTION and removal of cervical cerclage;  Surgeon: Willodean Rosenthalarolyn Harraway-Smith, MD;  Location: [facility];  Service: Obstetrics;  Laterality: N/A;   Family History  Problem Relation Age of Onset  . Diabetes Maternal Grandmother   . Mental illness Maternal Grandmother   . Hypertension Mother   . Thyroid disease Mother   . Diabetes Maternal Uncle   . Mental illness Maternal Uncle     History  Substance Use Topics  . Smoking status: Current Some Day Smoker -- 0.25 packs/day for 20 years    Types: Cigarettes  . Smokeless tobacco: Never Used  . Alcohol Use: No   OB History    Gravida Para Term Preterm AB TAB SAB Ectopic Multiple Living   12 2 0 2 10  10   2      Review of Systems  HENT: Positive for dental problem.   All other systems reviewed and are negative.  Allergies  Review of patient's allergies indicates no known allergies.  Home Medications   Prior to Admission medications   Medication Sig Start Date End Date Taking? Authorizing Provider  naproxen (NAPROSYN) 250 MG tablet Take by mouth 2 (two) times daily with a meal.   Yes Historical Provider, MD  amoxicillin (AMOXIL) 500 MG capsule Take 1 capsule (500 mg total) by mouth 3 (three) times daily. 02/28/15   Megan Stevens Korena Nass, MD  ciprofloxacin (CIPRO) 500 MG tablet Take 1 tablet (500 mg total) by mouth 2 (two) times daily. 12/15/13   Dorathy KinsmanVirginia Smith, CNM  docusate sodium (COLACE) 100 MG capsule Take 1 capsule (100 mg total) by mouth 2 (two) times daily. 12/12/13   Sandrea MatteJonathan Hall, MD  ibuprofen (ADVIL,MOTRIN) 600 MG tablet Take 1 tablet (600 mg total) by mouth every 6 (six) hours. 12/12/13   Sandrea MatteJonathan Hall, MD  oxyCODONE-acetaminophen (PERCOCET/ROXICET) 5-325 MG per tablet Take 1-2 tablets by mouth every 4 (four) hours as needed for severe pain (moderate - severe pain). 12/12/13   Sandrea Matte, MD  traMADol (ULTRAM) 50 MG tablet Take 1 tablet (50 mg total) by mouth every 6 (six) hours as needed. 02/28/15   Megan Crease, MD  traMADol (ULTRAM) 50 MG tablet Take 1 tablet (50 mg total) by mouth every 6 (six) hours as needed for moderate pain. 03/01/15   Megan Crease, MD   BP 118/68 mmHg  Pulse 74  Temp(Src) 98.3 F (36.8 C) (Oral)  Resp 16  Ht  (1.651 m)  Wt 173 lb (78.472 kg)  BMI 28.79 kg/m2  SpO2 100%  LMP 01/23/2015 Physical Exam  Constitutional: She is oriented to person, place,  and time. She appears well-developed and well-nourished. No distress.  HENT:  Head: Normocephalic and atraumatic.  Right Ear: Hearing normal.  Left Ear: Hearing normal.  Nose: Nose normal.  Mouth/Throat: Oropharynx is clear and moist and mucous membranes are normal.  Cavity on right lower most posterior molar without abscess formation.   Eyes: Conjunctivae and EOM are normal. Pupils are equal, round, and reactive to light.  Neck: Normal range of motion. Neck supple.  Cardiovascular: Regular rhythm, S1 normal and S2 normal.  Exam reveals no gallop and no friction rub.   No murmur heard. Pulmonary/Chest: Effort normal and breath sounds normal. No respiratory distress. She exhibits no tenderness.  Abdominal: Soft. Normal appearance and bowel sounds are normal. There is no hepatosplenomegaly. There is no tenderness. There is no rebound, no guarding, no tenderness at McBurney's point and negative Murphy's sign. No hernia.  Musculoskeletal: Normal range of motion.  Neurological: She is alert and oriented to person, place, and time. She has normal strength. No cranial nerve deficit or sensory deficit. Coordination normal. GCS eye subscore is 4. GCS verbal subscore is 5. GCS motor subscore is 6.  Skin: Skin is warm, dry and intact. No rash noted. No cyanosis.  Psychiatric: She has a normal mood and affect. Her speech is normal and behavior is normal. Thought content normal.  Nursing note and vitals reviewed.   ED Course  Procedures (including critical care time) DIAGNOSTIC STUDIES: Oxygen Saturation is 100% on RA, normal by my interpretation.    COORDINATION OF CARE: 11:54 PM- Pt advised of plan for treatment which includes medication and pt agrees.  Labs Review Labs Reviewed - No data to display  Imaging Review No results found.   EKG Interpretation None      MDM   Final diagnoses:  Toothache   Presents to the ER for evaluation of dental pain. Will initiate antibiotic coverage,  tramadol for pain. Follow-up with dentist.  I personally performed the services described in this documentation, which was scribed in my presence. The recorded information has been reviewed and is accurate.      Megan Crease, MD 03/04/15 [phone]

## 2015-02-28 NOTE — Discharge Instructions (Signed)
Dental Pain °A tooth ache may be caused by cavities (tooth decay). Cavities expose the nerve of the tooth to air and hot or cold temperatures. It may come from an infection or abscess (also called a boil or furuncle) around your tooth. It is also often caused by dental caries (tooth decay). This causes the pain you are having. °DIAGNOSIS  °Your caregiver can diagnose this problem by exam. °TREATMENT  °· If caused by an infection, it may be treated with medications which kill germs (antibiotics) and pain medications as prescribed by your caregiver. Take medications as directed. °· Only take over-the-counter or prescription medicines for pain, discomfort, or fever as directed by your caregiver. °· Whether the tooth ache today is caused by infection or dental disease, you should see your dentist as soon as possible for further care. °SEEK MEDICAL CARE IF: °The exam and treatment you received today has been provided on an emergency basis only. This is not a substitute for complete medical or dental care. If your problem worsens or new problems (symptoms) appear, and you are unable to meet with your dentist, call or return to this location. °SEEK IMMEDIATE MEDICAL CARE IF:  °· You have a fever. °· You develop redness and swelling of your face, jaw, or neck. °· You are unable to open your mouth. °· You have severe pain uncontrolled by pain medicine. °MAKE SURE YOU:  °· Understand these instructions. °· Will watch your condition. °· Will get help right away if you are not doing well or get worse. °Document Released: 09/22/2005 Document Revised: 12/15/2011 Document Reviewed: 05/10/2008 °ExitCare® Patient Information ©2015 [employer]. This information is not intended to replace advice given to you by your health care provider. Make sure you discuss any questions you have with your health care provider. ° °Emergency Department Resource Guide °1) Find a Doctor and Pay Out of Pocket °Although you won't have to find out who  is covered by your insurance plan, it is a good idea to ask around and get recommendations. You will then need to call the office and see if the doctor you have chosen will accept you as a new patient and what types of options they offer for patients who are self-pay. Some doctors offer discounts or will set up payment plans for their patients who do not have insurance, but you will need to ask so you aren't surprised when you get to your appointment. ° °2) Contact Your Local Health Department °Not all health departments have doctors that can see patients for sick visits, but many do, so it is worth a call to see if yours does. If you don't know where your local health department is, you can check in your phone book. The CDC also has a tool to help you locate your state's health department, and many state websites also have listings of all of their local health departments. ° °3) Find a Walk-in Clinic °If your illness is not likely to be very severe or complicated, you may want to try a walk in clinic. These are popping up all over the country in pharmacies, drugstores, and shopping centers. They're usually staffed by nurse practitioners or physician assistants that have been trained to treat common illnesses and complaints. They're usually fairly quick and inexpensive. However, if you have serious medical issues or chronic medical problems, these are probably not your best option. ° °No Primary Care Doctor: °- Call [facility] at  832-8000 - they can help you locate a primary   care doctor that  accepts your insurance, provides certain services, etc. °- Physician Referral Service- 1-800-533-3463 ° °Chronic Pain Problems: °Organization         Address  Phone   Notes  °[facility]  (336) 297-2271 Patients need to be referred by their primary care doctor.  ° °Medication Assistance: °Organization         Address  Phone   Notes  °[place] [facility] [address]  [address] °[place], [place] [address] (336) 641-8030 --Must be a resident of [place] °-- Must have NO insurance coverage whatsoever (no Medicaid/ Medicare, etc.) °-- The pt. MUST have a primary care doctor that directs their care regularly and follows them in the community °  °[facility]  (866) 331-1348   °[employer]  (888) 892-1162   ° °Agencies that provide inexpensive medical care: °Organization         Address  Phone   Notes  °[facility]  (336) 832-8035   °[facility]    (336) 832-7272   °[facility] [address] °[place], [place] [address] (336) 832-4777   °[facility] [address], °[place] (336) 271-4999   °[facility]    (336) 373-0678   °[facility]    (336) 272-1050   °[facility] ° [address], [place] Phone:  (336) 832-4444, Fax:  (336) 832-4440 Hours of Operation:  9 am - 6 pm, M-F.  Also accepts Medicaid/Medicare and self-pay.  °[facility] ° [address], [place] Phone: (336) 832-3150, Fax: (336) 832-3151. Hours of Operation:  8:30 am - 5:30 pm, M-F.  Also accepts Medicaid and self-pay.  °[facility] [place] [address], [place] Phone: (336) 878-6027   °[facility] [address], [place], [place] (336)723-1848, Ext. 123 Mondays & Thursdays: 7-9 AM.  First 15 patients are seen on a first come, first serve basis. °  ° °Medicaid-accepting [place] Providers: ° °Organization         Address  Phone   Notes  °[facility] [address], [place] (336) 641-2100 Also accepts self-pay patients.  °[facility] [address], [place] ° (336) 856-9996   °[facility] [address], [place] (336) 288-8857   °[facility] [address] [place] [address], [place] (336) 299-7000   °Veita Bland [address], [place]  ° (336) 373-1557 Only accepts [place] Access Medicaid patients after they have their name applied to their card.  ° °Self-Pay (no insurance) in [place]: ° °Organization         Address  Phone   Notes  °Sickle Cell Patients, [facility] [address], [place] (336) 832-1970   °[facility] [address], [place] (336) 832-4400   °[facility] ° [address], [place] (336) 992-4800   °[facility]/Dr. Osei-Bonsu ° [address] [place] [address], [place] or [address], [place] (336) 841-8500 Phone number for both [place] and [place] locations is the same.  °[facility] [address], [place] (336) 299-0000   °[facility] [address] [place] [address], [place] or [address] (336) 852-7530 °(336) 878-2260   °  [facility] [address], [place] (336) 350-1642, phone; (336) 294-5005, fax Sees patients 1st and 3rd Saturday of every month.  Must not qualify for public or private insurance (i.e. Medicaid, Medicare, [place] Health Choice, Veterans' Benefits) • Household income should be no more than 200% of the poverty level •The clinic cannot treat you if you are pregnant or think you are pregnant • Sexually transmitted diseases are not treated at the clinic.  ° ° °Dental Care: °Organization         Address  Phone  Notes  °[facility] [address], [place] (336) 641-6152 Accepts children up to age 21 who are enrolled in Medicaid or [place] Health Choice; pregnant women with a Medicaid card; and children who have applied for Medicaid or [place] Health Choice, but were declined, whose parents can pay a reduced fee at time of service.  °[facility] [place]  [address], [place] (336) 641-7733 Accepts children up to age 21 who are  enrolled in Medicaid or [place] Health Choice; pregnant women with a Medicaid card; and children who have applied for Medicaid or [place] Health Choice, but were declined, whose parents can pay a reduced fee at time of service.  °[facility] ° [address], [place] (336) 641-4533 Patients are seen by appointment only. Walk-ins are not accepted. [facility] will see patients 18 years of age and older. °Monday - Tuesday (8am-5pm) °Most Wednesdays (8:30-5pm) °$30 per visit, cash only  °[facility] ° [address], [place] (336) 641-4533 Patients are seen by appointment only. Walk-ins are not accepted. [facility] will see patients 18 years of age and older. °One Wednesday Evening (Monthly: Volunteer Based).  $30 per visit, cash only  °[facility]  (919) 537-3737 for adults; Children under age 4, call Graduate Pediatric Dentistry at (919) [phone]. Children aged 4-14, please call (919) [phone] to request a pediatric application. ° Dental services are provided in all areas of dental care including fillings, crowns and bridges, complete and partial dentures, implants, gum treatment, root canals, and extractions. Preventive care is also provided. Treatment is provided to both adults and children. °Patients are selected via a lottery and there is often a waiting list. °  °[facility] [address], °[place] ° (336) 763-8833 [email or web address] °  °[facility] [address], [place], [place] (336)723-1848, Ext. 123 Second and Fourth Thursday of each month, opens at 6:30 AM; Clinic ends at 9 AM.  Patients are seen on a first-come first-served basis, and a limited number are seen during each clinic.  ° °[facility] ° [address], [place], [place] (336) 723-7904   Eligibility Requirements °You must have lived in [place], [place], or [place] counties for at least the last three months. °  You  cannot be eligible for state or federal sponsored healthcare insurance, including Veterans Administration, Medicaid, or Medicare. °  You generally cannot be eligible for healthcare insurance through your employer.  °  How to apply: °Eligibility screenings are held every Tuesday and Wednesday afternoon from 1:00 pm until 4:00 pm. You do not need an appointment for the interview!  °[facility] [address] [place] [address], [place], [place] 336-631-2330   °[facility] [facility]  336-342-8273   °[facility]  336-703-3100   °[facility]  336-570-6415   ° °Behavioral Health Resources in the Community: °Intensive Outpatient Programs °Organization         Address  Phone  Notes  °[facility] [address], [place], [place] 336-878-6098   °[facility] [address], [place], [place] 336-832-9800   °[facility] [address], [place], [place] ° 336-882-2125   °[facility] [address],  °[place], [place] 1-800-853-5163 or 336-641-4981   °Substance Abuse Resources °Organization         Address  Phone  Notes  °[facility]  336-882-2125   °[facility]  336-784-9470   °[facility]  336-285-9073   °[facility]  336-845-3988   °Residential & Outpatient Substance Abuse Program  1-800-659-3381   °Psychological Services °Organization         Address  Phone  Notes  °[facility]  336- 832-9600   °[facility]  336- 378-7881   °[facility] [address], [place] 1-800-853-5163 or 336-641-4981   ° °Mobile Crisis Teams °Organization         Address  Phone  Notes  °[facility], Mobile Crisis Care Unit  1-877-626-1772   °Assertive °[facility] ° [address] [place], [place] 336-834-9664   °Sharon DeEsch [address] °[place] 336-554-5454   ° °Self-Help/Support  Groups °Organization         Address  Phone             Notes  °[facility] - variety of support groups  336- 373-1402 Call for more information  °Narcotics Anonymous (NA), [facility] [address], °[place]  2 meetings at this location  ° °Residential Treatment Programs °Organization         Address  Phone  Notes  °[facility] [address],    °[place]  1-866-801-8205   °[facility] ° [address], [place], [place] 704-293-8524   °[facility] [address], [place] 336-845-3988 Admissions: 8am-3pm M-F  °[facility] [address]    °[place], [place] 336-841-1104   °[facility] [address], [place], [place] 336-379-7146   °[facility] [address]  °[place], [place] 336-285-9073   °[facility] - Intensive Outpatient [address], [place], [place] 336-852-3033   °[facility] [address]  °[place], [place] 1-877-615-2722 or 336-784-9470   °[facility] ([facility] [address] , [place] 336-227-7417 Accepts Medicaid  °[facility] [address]  °[place] 1-800-659-3381 Substance Abuse/Addiction Treatment  ° °[place] Behavioral Health Resources °Organization         Address  Phone  Notes  °[facility]  (888) 581-9988   °Julie Brannon, PhD [address] [place], [place]   (336) 349-5553 or (336) 951-0000   °[facility]   [address] °[place], [place] (336) 349-4454   °[facility] [address], [place], [place] (336) 342-8316 Insurance/Medicaid/sponsorship through [facility]  °[facility] [address]                                    [place],  (336) 342-8316 Therapy/tele-psych/case  °[facility]   [address]  ° [place], [place] (336) 349-2233    °Dr. Arfeen  (336) 349-4544   °[facility] [place]  [place]  [employer] [place] [facility] 1) [address], [place] °2) [address], [place] °3)  [address], [place] (336) 349-3220 °(336) 342-7768 ° °(336) 342-8140   °[place] [facility] (336) 342-1394 or (336) 342-3537 (After Hours)    ° ° ° °

## 2015-02-28 NOTE — ED Notes (Signed)
Pt c/o pain to right lower jaw/teeth x 1 week

## 2015-03-01 MED ORDER — TRAMADOL HCL 50 MG PO TABS: 50.0000 mg | ORAL_TABLET | Freq: Four times a day (QID) | ORAL | Status: DC | PRN

## 2015-03-18 ENCOUNTER — Encounter (HOSPITAL_COMMUNITY): Payer: Self-pay | Admitting: *Deleted

## 2015-03-18 ENCOUNTER — Emergency Department (HOSPITAL_COMMUNITY)
Admission: EM | Admit: 2015-03-18 | Discharge: 2015-03-18 | Disposition: A | Discharge status: Home / Self Care | Payer: Medicaid Other | Attending: Emergency Medicine | Admitting: Emergency Medicine

## 2015-03-18 DIAGNOSIS — Z3202 Encounter for pregnancy test, result negative: Secondary | ICD-10-CM | POA: Diagnosis not present

## 2015-03-18 DIAGNOSIS — Z8659 Personal history of other mental and behavioral disorders: Secondary | ICD-10-CM | POA: Diagnosis not present

## 2015-03-18 DIAGNOSIS — R112 Nausea with vomiting, unspecified: Secondary | ICD-10-CM | POA: Insufficient documentation

## 2015-03-18 DIAGNOSIS — R42 Dizziness and giddiness: Secondary | ICD-10-CM | POA: Insufficient documentation

## 2015-03-18 DIAGNOSIS — Z720 Tobacco use: Secondary | ICD-10-CM | POA: Diagnosis not present

## 2015-03-18 DIAGNOSIS — Z9889 Other specified postprocedural states: Secondary | ICD-10-CM | POA: Insufficient documentation

## 2015-03-18 LAB — URINALYSIS, ROUTINE W REFLEX MICROSCOPIC
Bilirubin Urine: NEGATIVE
Glucose, UA: NEGATIVE mg/dL
Hgb urine dipstick: NEGATIVE
KETONES UR: NEGATIVE mg/dL
Leukocytes, UA: NEGATIVE
Nitrite: NEGATIVE
Protein, ur: NEGATIVE mg/dL
Specific Gravity, Urine: >1.03 — ABNORMAL HIGH (ref 1.005–1.030)
Urobilinogen, UA: 0.2 mg/dL (ref 0.0–1.0)
pH: 6 (ref 5.0–8.0)

## 2015-03-18 LAB — PREGNANCY, URINE: PREG TEST UR: NEGATIVE

## 2015-03-18 MED ORDER — ONDANSETRON 4 MG PO TBDP: 4.0000 mg | ORAL_TABLET | Freq: Three times a day (TID) | ORAL | Status: AC | PRN

## 2015-03-18 MED ORDER — ONDANSETRON 4 MG PO TBDP
4.0000 mg | ORAL_TABLET | Freq: Once | ORAL | Status: AC
  Administered 2015-03-18: 4 mg via ORAL
  Filled 2015-03-18: qty 1

## 2015-03-18 NOTE — ED Provider Notes (Signed)
CSN: [id number]     Arrival date & time 03/18/15  0236 History   First MD Initiated Contact with Patient 03/18/15 0257     Chief Complaint  Patient presents with  . Emesis     (Consider location/radiation/quality/duration/timing/severity/associated sxs/prior Treatment) HPI  This is a 29 year old G7, P2 female who presents with vomiting and dizziness. Patient reports onset of symptoms yesterday at 3 PM. She thinks it may be food related. She states "I have not been able to keep anything down." She denies any diarrhea. Patient reports that her last period was in April. She was started on oral contraception is and states that she was told she might not get.. She states that she is mostly compliant with her oral contraception is but has missed 4 days. She is unsure whether she would be pregnant. She denies any fevers, cough, chest pain, abdominal pain, urinary symptoms, vaginal discharge, vaginal bleeding.  Past Medical History  Diagnosis Date  . Abnormal pap   . Miscarriage 12/24/2012  . Postpartum depression   . Abnormal Pap smear   . Vaginal bleeding in pregnancy 04/19/2013  . History of preterm labor 08/01/2013  . Pregnant 08/01/2013  . History of cesarean section 08/01/2013  . Abnormal Pap smear 08/08/2013    Pap CIN2/CIN3/CIS(HSIL)  Needs colpo   Past Surgical History  Procedure Laterality Date  . Cesarean section    . Dilation and curettage of uterus  2011  . Finger fracture surgery Right     pinky as child  . Cervical cerclage N/A 10/04/2013    Procedure: CERCLAGE CERVICAL;  Surgeon: Tilda Burrow, MD;  Location: [facility];  Service: Gynecology;  Laterality: N/A;  . Cesarean section N/A 12/10/2013    Procedure: CESAREAN SECTION and removal of cervical cerclage;  Surgeon: Willodean Rosenthal, MD;  Location: [facility];  Service: Obstetrics;  Laterality: N/A;   Family History  Problem Relation Age of Onset  . Diabetes Maternal Grandmother   . Mental illness Maternal Grandmother    . Hypertension Mother   . Thyroid disease Mother   . Diabetes Maternal Uncle   . Mental illness Maternal Uncle    History  Substance Use Topics  . Smoking status: Current Some Day Smoker -- 0.25 packs/day for 20 years    Types: Cigarettes  . Smokeless tobacco: Never Used  . Alcohol Use: No   OB History    Gravida Para Term Preterm AB TAB SAB Ectopic Multiple Living   12 2 0 Review of Systems  Constitutional: Negative for fever.  Respiratory: Negative for cough, chest tightness and shortness of breath.   Cardiovascular: Negative for chest pain.  Gastrointestinal: Positive for nausea and vomiting. Negative for abdominal pain and diarrhea.  Genitourinary: Negative for dysuria.  Neurological: Negative for headaches.  All other systems reviewed and are negative.     Allergies  Review of patient's allergies indicates no known allergies.  Home Medications   Prior to Admission medications   Medication Sig Start Date End Date Taking? Authorizing Provider  ondansetron (ZOFRAN-ODT) 4 MG disintegrating tablet Take 1 tablet (4 mg total) by mouth every 8 (eight) hours as needed for nausea or vomiting. 03/18/15   Shon Baton, MD   BP 114/75 mmHg  Pulse 79  Temp(Src) 97.8 F (36.6 C)  Resp 20  Ht  (1.651 m)  Wt 175 lb (79.379 kg)  BMI 29.12 kg/m2  LMP 01/31/2015 Physical Exam  Constitutional: She is oriented to person, place, and time. She appears well-developed and well-nourished. No distress.  HENT:  Head: Normocephalic and atraumatic.  Cardiovascular: Normal rate, regular rhythm and normal heart sounds.   No murmur heard. Pulmonary/Chest: Effort normal and breath sounds normal. No respiratory distress. She has no wheezes.  Abdominal: Soft. Bowel sounds are normal. There is no tenderness. There is no rebound.  Musculoskeletal: She exhibits no edema.  Neurological: She is alert and oriented to person, place, and time.  Skin: Skin is warm and dry.   Psychiatric: She has a normal mood and affect.  Nursing note and vitals reviewed.   ED Course  Procedures (including critical care time) Labs Review Labs Reviewed  URINALYSIS, ROUTINE W REFLEX MICROSCOPIC (NOT AT [facility]) - Abnormal; Notable for the following:    Specific Gravity, Urine >1.030 (*)    All other components within normal limits  PREGNANCY, URINE    Imaging Review No results found.   EKG Interpretation None      MDM   Final diagnoses:  Non-intractable vomiting with nausea, vomiting of unspecified type    Patient presents with vomiting. Mostly isolated. Does endorse dizziness but appears well-hydrated. Unsure of whether she may be pregnant. Urinalysis and urine pregnancy sent. Patient given Zofran. Urinalysis without evidence of ketones or suggestion of dehydration. Urine pregnancy is negative. Patient was able to tolerate food following Zofran. Discussed with patient that this may be related to gastritis or early gastroenteritis. Doubt emergent medical condition. Will treat symptomatically.  After history, exam, and medical workup I feel the patient has been appropriately medically screened and is safe for discharge home. Pertinent diagnoses were discussed with the patient. Patient was given return precautions.     Shon Baton, MD 03/18/15 [phone]

## 2015-03-18 NOTE — ED Notes (Signed)
Pt c/o dizziness and nausea

## 2015-03-18 NOTE — Discharge Instructions (Signed)
You were seen today for nausea and vomiting. Your exam is reassuring and your urine pregnancy is negative. You showed no signs of dehydration.  Nausea and Vomiting Nausea is a sick feeling that often comes before throwing up (vomiting). Vomiting is a reflex where stomach contents come out of your mouth. Vomiting can cause severe loss of body fluids (dehydration). Children and elderly adults can become dehydrated quickly, especially if they also have diarrhea. Nausea and vomiting are symptoms of a condition or disease. It is important to find the cause of your symptoms. CAUSES   Direct irritation of the stomach lining. This irritation can result from increased acid production (gastroesophageal reflux disease), infection, food poisoning, taking certain medicines (such as nonsteroidal anti-inflammatory drugs), alcohol use, or tobacco use.  Signals from the brain.These signals could be caused by a headache, heat exposure, an inner ear disturbance, increased pressure in the brain from injury, infection, a tumor, or a concussion, pain, emotional stimulus, or metabolic problems.  An obstruction in the gastrointestinal tract (bowel obstruction).  Illnesses such as diabetes, hepatitis, gallbladder problems, appendicitis, kidney problems, cancer, sepsis, atypical symptoms of a heart attack, or eating disorders.  Medical treatments such as chemotherapy and radiation.  Receiving medicine that makes you sleep (general anesthetic) during surgery. DIAGNOSIS Your caregiver may ask for tests to be done if the problems do not improve after a few days. Tests may also be done if symptoms are severe or if the reason for the nausea and vomiting is not clear. Tests may include:  Urine tests.  Blood tests.  Stool tests.  Cultures (to look for evidence of infection).  X-rays or other imaging studies. Test results can help your caregiver make decisions about treatment or the need for additional  tests. TREATMENT You need to stay well hydrated. Drink frequently but in small amounts.You may wish to drink water, sports drinks, clear broth, or eat frozen ice pops or gelatin dessert to help stay hydrated.When you eat, eating slowly may help prevent nausea.There are also some antinausea medicines that may help prevent nausea. HOME CARE INSTRUCTIONS   Take all medicine as directed by your caregiver.  If you do not have an appetite, do not force yourself to eat. However, you must continue to drink fluids.  If you have an appetite, eat a normal diet unless your caregiver tells you differently.  Eat a variety of complex carbohydrates (rice, wheat, potatoes, bread), lean meats, yogurt, fruits, and vegetables.  Avoid high-fat foods because they are more difficult to digest.  Drink enough water and fluids to keep your urine clear or pale yellow.  If you are dehydrated, ask your caregiver for specific rehydration instructions. Signs of dehydration may include:  Severe thirst.  Dry lips and mouth.  Dizziness.  Dark urine.  Decreasing urine frequency and amount.  Confusion.  Rapid breathing or pulse. SEEK IMMEDIATE MEDICAL CARE IF:   You have blood or brown flecks (like coffee grounds) in your vomit.  You have black or bloody stools.  You have a severe headache or stiff neck.  You are confused.  You have severe abdominal pain.  You have chest pain or trouble breathing.  You do not urinate at least once every 8 hours.  You develop cold or clammy skin.  You continue to vomit for longer than 24 to 48 hours.  You have a fever. MAKE SURE YOU:   Understand these instructions.  Will watch your condition.  Will get help right away if you are not  doing well or get worse. Document Released: 09/22/2005 Document Revised: 12/15/2011 Document Reviewed: 02/19/2011 [facility] Patient Information 2015 [place], [place]. This information is not intended to replace advice given to  you by your health care provider. Make sure you discuss any questions you have with your health care provider.

## 2015-04-03 ENCOUNTER — Ambulatory Visit: Payer: Medicaid Other | Admitting: Adult Health

## 2015-04-03 ENCOUNTER — Encounter: Payer: Self-pay | Admitting: Adult Health

## 2015-11-28 IMAGING — US US OB DETAIL+14 WK
1 series · 12 of 28 positions shown · non-contrast
Comparison: none

[Series 1: us ob detail+14 wk · 0.12mm/px · 12 of 76 slices shown]
[im 3/76]
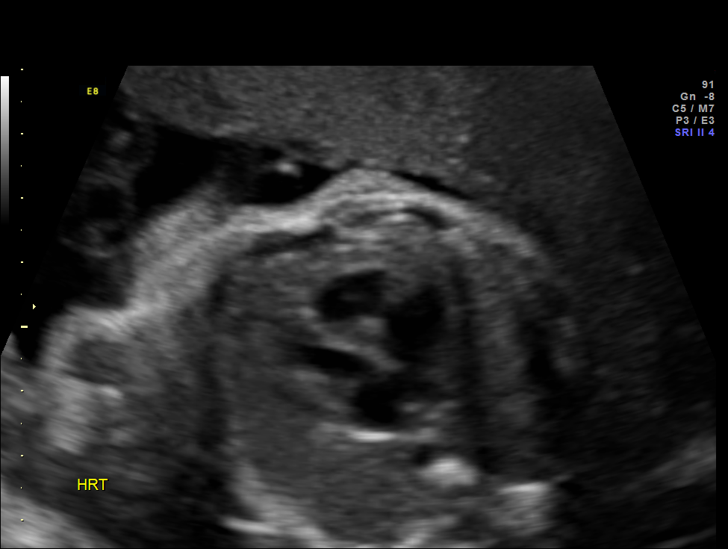
[im 9/76]
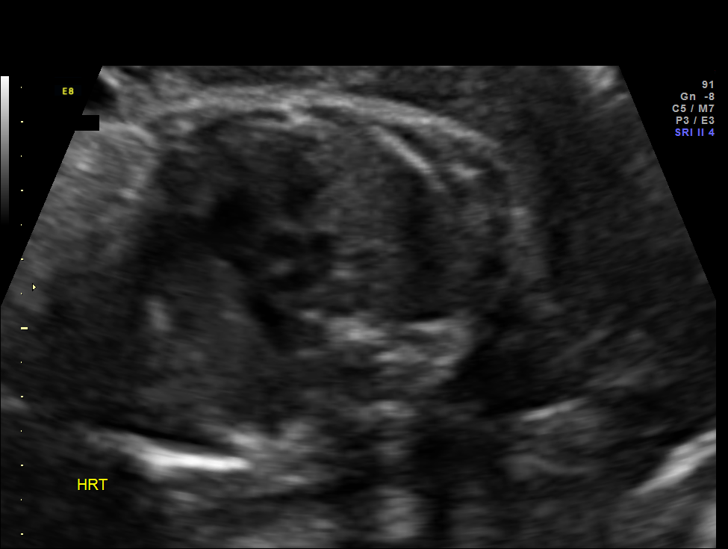
[im 14/76]
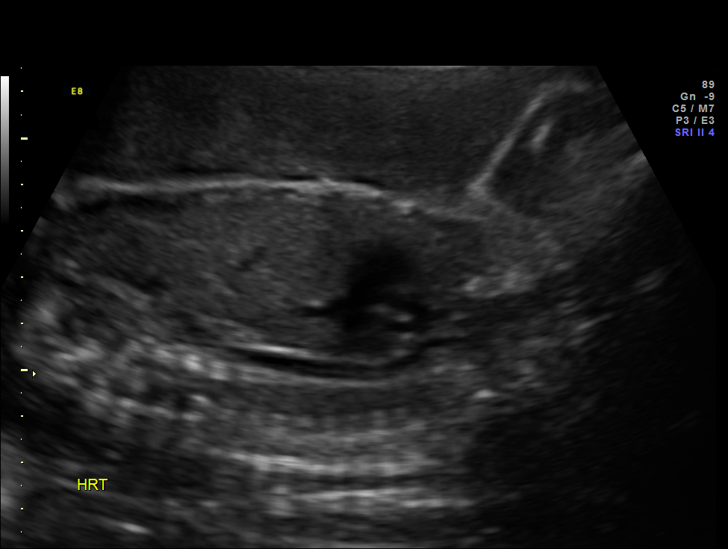
[im 23/76]
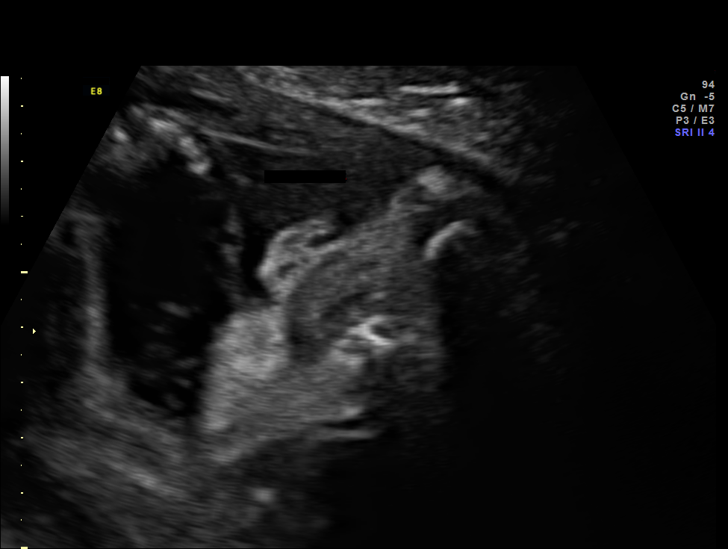
[im 28/76]
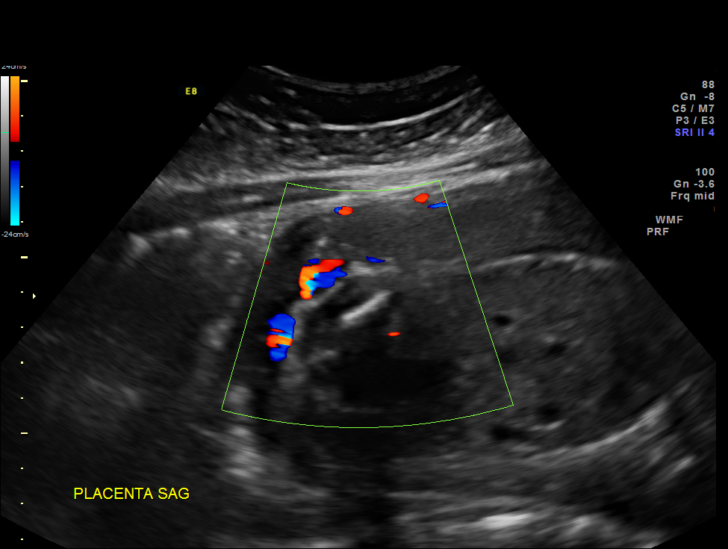
[im 34/76]
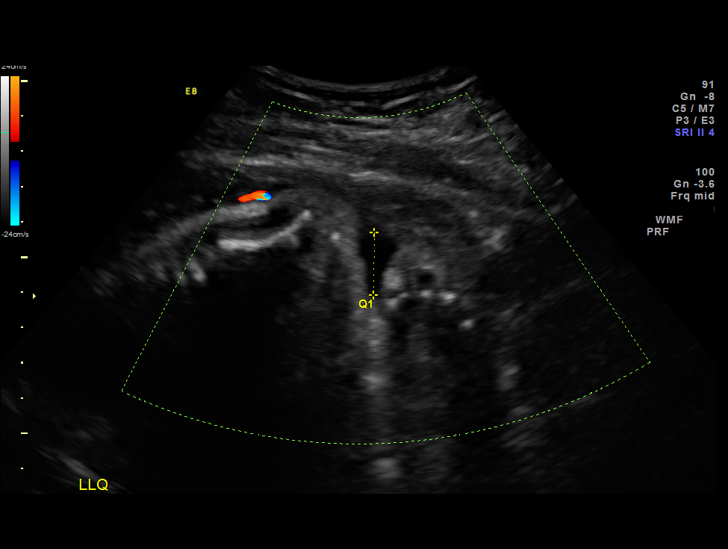
[im 42/76]
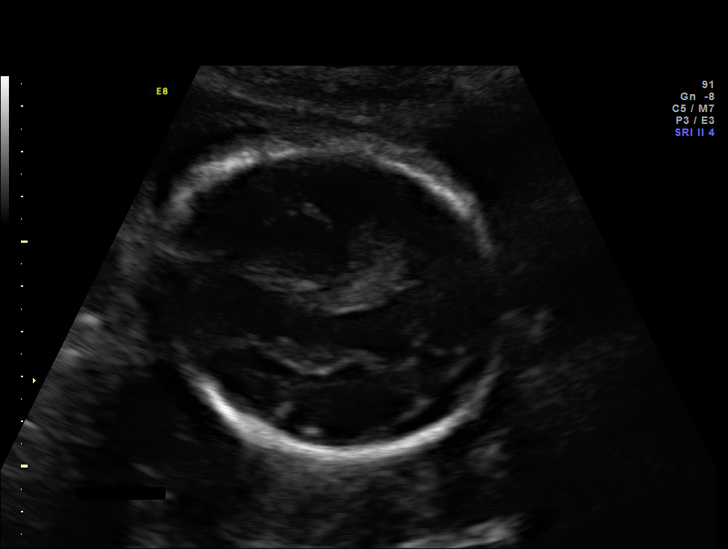
[im 48/76]
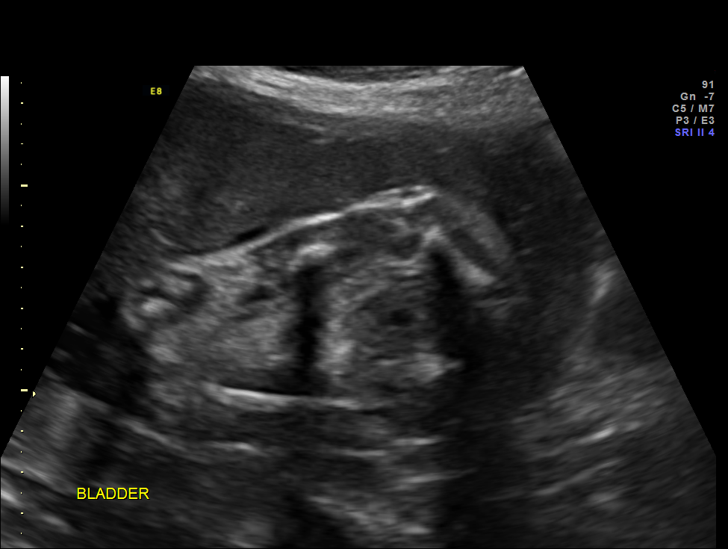
[im 53/76]
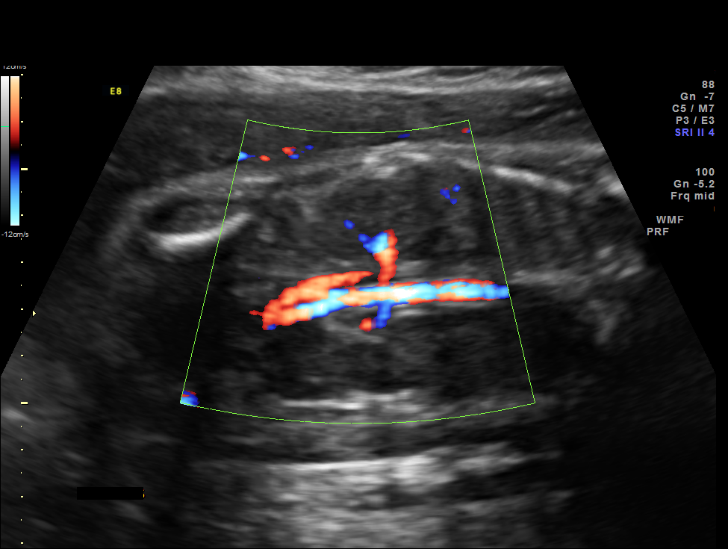
[im 62/76]
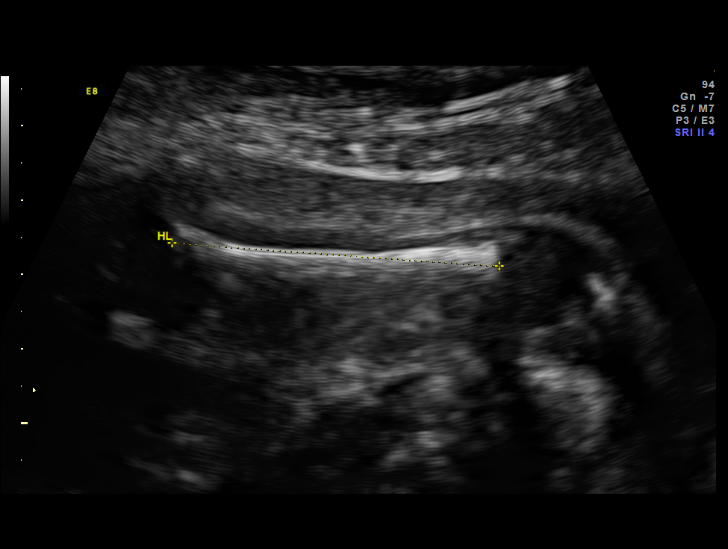
[im 67/76]
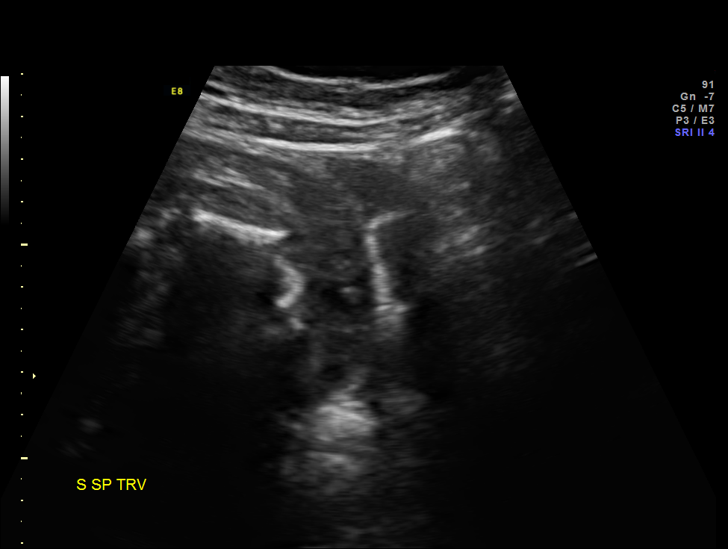
[im 73/76]
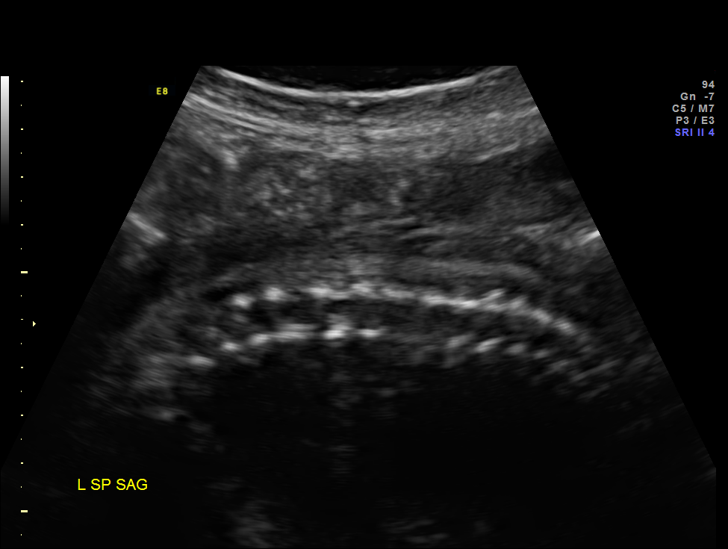

[12 of 28 positions shown; findings below may reference images not displayed]

OBSTETRICS REPORT
                      (Signed Final 12/05/2013 [DATE])

Service(s) Provided

 US OB DETAIL + 14 WK                                  76811.0
Indications

 Premature rupture of membranes - leaking fluid
 Poor obstetric history-Recurrent (habitual) abortion
 (3 consecutive ab's)
 Poor obstetric history: Previous midtrimester loss x
 2 (19 weeks)
 Poor obstetric history: Previous preterm delivery @
 24 weeks
 Cervical insufficiency
 Previous cervical surgery (s/p cerclage this
 pregnancy)
 Previous cesarean section (Classical)
 Cigarette smoker
Fetal Evaluation

 Num Of Fetuses:    1
 Fetal Heart Rate:  141                          bpm
 Cardiac Activity:  Observed
 Presentation:      Cephalic
 Placenta:          Anterior, above cervical os
 P. Cord            Marginal insertion
 Insertion:

 Amniotic Fluid
 AFI FV:      Subjectively decreased
 AFI Sum:     5.88    cm      < 3  %Tile     Larg Pckt:    2.54  cm
 RUQ:   1.56    cm   RLQ:    2.54   cm    LUQ:   0       cm   LLQ:    1.78   cm
Biometry

 BPD:     69.9  mm     G. Age:  28w 0d                CI:         77.2   70 - 86
 OFD:     90.6  mm                                    FL/HC:      20.3   18.6 -

 HC:     257.1  mm     G. Age:  27w 6d       38  %    HC/AC:      1.12   1.05 -

 AC:     229.9  mm     G. Age:  27w 3d       40  %    FL/BPD:     74.8   71 - 87
 FL:      52.3  mm     G. Age:  27w 6d       48  %    FL/AC:      22.7   20 - 24
 HUM:     44.2  mm     G. Age:  26w 2d       20  %
 CER:     33.9  mm     G. Age:  29w 3d       82  %

 Est. FW:    0072  gm      2 lb 7 oz     56  %
Gestational Age

 LMP:           27w 3d        Date:  05/27/13                 EDD:   03/03/14
 U/S Today:     27w 6d                                        EDD:   02/28/14
 Best:          27w 3d     Det. By:  LMP  (05/27/13)          EDD:   03/03/14
Anatomy

 Cranium:          Appears normal         Aortic Arch:      Appears normal
 Fetal Cavum:      Appears normal         Ductal Arch:      Appears normal
 Ventricles:       Appears normal         Diaphragm:        Appears normal
 Choroid Plexus:   Appears normal         Stomach:          Appears normal
 Cerebellum:       Appears normal         Abdomen:          Appears normal
 Posterior Fossa:  Appears normal         Abdominal Wall:   Not well visualized
 Nuchal Fold:      Not applicable (>20    Cord Vessels:     Appears normal (3
                   wks GA)                                  vessel cord)
 Face:             Appears normal         Kidneys:          Appear normal
                   (orbits and profile)
 Lips:             Appears normal         Bladder:          Appears normal
 Heart:            Appears normal         Spine:            Not well visualized
                   (4CH, axis, and
                   situs)
 RVOT:             Appears normal         Lower             Visualized
                                          Extremities:
 LVOT:             Appears normal         Upper             Visualized
                                          Extremities:

 Other:  Technically difficult due to low amniotic fluid and fetal position.
Targeted Anatomy

 Fetal Central Nervous System
 Cisterna Magna:
Cervix Uterus Adnexa

 Cervix:       Not visualized.
 Left Ovary:    Within normal limits.
 Right Ovary:   Within normal limits.
 Adnexa:     No abnormality visualized.
Comments

 Ms. Eun Chang is currently admitted due to PROM.  She
 previously underwent cerclage for suspected cervical
 insufficiency.  The cerclage is currently in place.  She recently
 completed a course of latency antibiotics.  There is some
 contraversy about whether or not to remove cerclage
 following PROM.  Although there may be a longer latency
 course by keeping the cerclage in place, it is also associated
 with a higher risk of intraamniotic infection.
Impression

 Single IUP at 27 [DATE] weeks
 PROM, cervical insufficiency with cerclage in place
 Normal fetal anatomic survey
 Fetal growth is appropriate (56th %tile)
 Subjectively decreased amniotic fluid volume with an AFI of
 5.33 cm.
Recommendations

 Recommend serial growth scans every 3-4 weeks while in-
 house
 Otherwise follow up ultrasounds as clinically indicated

 questions or concerns.

## 2020-02-10 ENCOUNTER — Telehealth: Payer: Self-pay | Admitting: *Deleted

## 2020-02-10 NOTE — Telephone Encounter (Signed)
Erroneous encounter
